# Patient Record
Sex: Male | Born: 1960 | Race: White | Hispanic: No | Marital: Married | Smoking: Never smoker
Health system: Southern US, Community
[De-identification: ages and names within clinical notes are randomized; demographics above are authoritative.]

## PROBLEM LIST (undated history)

## (undated) DIAGNOSIS — I10 Essential (primary) hypertension: Secondary | ICD-10-CM

## (undated) DIAGNOSIS — M109 Gout, unspecified: Secondary | ICD-10-CM

## (undated) DIAGNOSIS — E119 Type 2 diabetes mellitus without complications: Secondary | ICD-10-CM

## (undated) DIAGNOSIS — R011 Cardiac murmur, unspecified: Secondary | ICD-10-CM

## (undated) DIAGNOSIS — K219 Gastro-esophageal reflux disease without esophagitis: Secondary | ICD-10-CM

## (undated) DIAGNOSIS — G4730 Sleep apnea, unspecified: Secondary | ICD-10-CM

## (undated) DIAGNOSIS — M1990 Unspecified osteoarthritis, unspecified site: Secondary | ICD-10-CM

## (undated) DIAGNOSIS — T8859XA Other complications of anesthesia, initial encounter: Secondary | ICD-10-CM

## (undated) HISTORY — PX: COLONOSCOPY: SHX174

## (undated) HISTORY — DX: Gastro-esophageal reflux disease without esophagitis: K21.9

## (undated) HISTORY — PX: OTHER SURGICAL HISTORY: SHX169

## (undated) HISTORY — DX: Unspecified osteoarthritis, unspecified site: M19.90

---

## 2004-01-29 DIAGNOSIS — R945 Abnormal results of liver function studies: Secondary | ICD-10-CM | POA: Insufficient documentation

## 2004-08-23 HISTORY — PX: HERNIA REPAIR: SHX51

## 2004-08-23 HISTORY — PX: CHOLECYSTECTOMY: SHX55

## 2009-03-26 ENCOUNTER — Ambulatory Visit: Payer: Self-pay | Admitting: Cardiology

## 2010-04-10 ENCOUNTER — Encounter: Admission: RE | Admit: 2010-04-10 | Discharge: 2010-04-10 | Payer: Self-pay | Admitting: Orthopedic Surgery

## 2014-07-08 ENCOUNTER — Encounter: Payer: Self-pay | Admitting: Gastroenterology

## 2014-07-16 ENCOUNTER — Encounter: Payer: Self-pay | Admitting: Gastroenterology

## 2014-08-21 ENCOUNTER — Ambulatory Visit: Payer: Self-pay | Admitting: Gastroenterology

## 2014-08-23 DIAGNOSIS — K219 Gastro-esophageal reflux disease without esophagitis: Secondary | ICD-10-CM

## 2014-08-23 HISTORY — DX: Gastro-esophageal reflux disease without esophagitis: K21.9

## 2014-08-29 ENCOUNTER — Ambulatory Visit: Payer: Self-pay | Admitting: Gastroenterology

## 2014-08-29 ENCOUNTER — Encounter: Payer: Self-pay | Admitting: Cardiovascular Disease

## 2014-09-06 DIAGNOSIS — I209 Angina pectoris, unspecified: Secondary | ICD-10-CM | POA: Insufficient documentation

## 2014-09-25 ENCOUNTER — Encounter: Payer: Self-pay | Admitting: Gastroenterology

## 2014-09-25 ENCOUNTER — Other Ambulatory Visit: Payer: Self-pay

## 2014-09-25 ENCOUNTER — Ambulatory Visit (INDEPENDENT_AMBULATORY_CARE_PROVIDER_SITE_OTHER): Payer: BLUE CROSS/BLUE SHIELD | Admitting: Gastroenterology

## 2014-09-25 ENCOUNTER — Encounter (INDEPENDENT_AMBULATORY_CARE_PROVIDER_SITE_OTHER): Payer: Self-pay

## 2014-09-25 VITALS — BP 144/88 | HR 79 | Temp 98.1°F | Ht 71.0 in | Wt 247.2 lb

## 2014-09-25 DIAGNOSIS — K625 Hemorrhage of anus and rectum: Secondary | ICD-10-CM

## 2014-09-25 DIAGNOSIS — K219 Gastro-esophageal reflux disease without esophagitis: Secondary | ICD-10-CM | POA: Insufficient documentation

## 2014-09-25 DIAGNOSIS — K2270 Barrett's esophagus without dysplasia: Secondary | ICD-10-CM

## 2014-09-25 MED ORDER — PEG-KCL-NACL-NASULF-NA ASC-C 100 G PO SOLR: 1.0000 | Freq: Once | ORAL | Status: DC

## 2014-09-25 NOTE — Patient Instructions (Signed)
UPPER ENDOSCOPY  AND COLONOSCOPY IN 2-3 WEEKS.  FOLLOW UP IN 6-8 WEEKS.

## 2014-09-25 NOTE — Progress Notes (Signed)
On recall list 

## 2014-09-25 NOTE — Assessment & Plan Note (Signed)
SX CONTROLLED WITH DIET.  EGD FOR BARRETT'S SURVEILLANCE

## 2014-09-25 NOTE — Assessment & Plan Note (Signed)
WITH FAMILY HISTORY OF POLYPS. SX LIKELY DUE TO HEMORRHOIDS, LESS LIKELY CANCER OR POLYPS.  WOKE UP DURING LAST TCS IN 2010 AFTER FEN 150 MCG/VERSED 8 MG IV. TCS/?HEMORRHOID BANDING WITH MAC. DISCUSSED BENEFITS, AND MANAGEMENT OF HEMORRHOIDS([facility] BANDING, FLEX SIG BANDING, OR SURGERY). DISCUSSED PROCEDURE, BENEFITS, & RISKS: < 1% chance of medication reaction, bleeding, perforation, .PELVIC VEIN SEPSIS, or rupture of spleen/liver. FOLLOW UP IN 6 WEEKS.

## 2014-09-25 NOTE — Progress Notes (Addendum)
   Subjective:    Patient ID: Bryan Austin, male    DOB: Dec 28, 1960, 54 y.o.   MRN: [id number]  Ranae Palms, MD  HPI No rectal pressure??. NOTICES THEM THE MOST IF HE STRAINS TO USE THE HAVE A BM. BMs: TWICE A DAY,. CAN HAVE CONSTIPATION OFF AND ON. USUALLY EAT S AND GOES TO BATHROOM. IF HE WIPES MAY SEE BLOOD. HAD TCS WHEN AT AGE 64. BLEEDING REAL BAD AT THE TIME AND GRANDMOTHER HAD COLON CA. NO PAIN OR THROBBING. ITCHING IN RECTUM OFF AND ON. HAS TO WIPE MULTIPLE TIMES TO FEEL HE GETS CLEAN. RARE ASA.  Past Medical History  Diagnosis Date  . GERD without esophagitis 2016    NEG CARDIAC EVALUATION [facility]    Past Surgical History  Procedure Laterality Date  . Hernia repair  8466    COMPLICATED BY PROLONGED ILEUS  . Cholecystectomy  2006    GB FULL OF "SAND", NEEDED A DRAIN DUE TO INFECTION  . Colonoscopy  [facility]   No Known Allergies  Current Outpatient Prescriptions  Medication Sig Dispense Refill  . aspirin 81 MG tablet Take 81 mg by mouth as needed for pain.     . Family History  Problem Relation Age of Onset  . Colon polyps Mother   . Kidney disease Mother   . Colon cancer Maternal Grandmother    History  Substance Use Topics  . Smoking status: Never Smoker   . Smokeless tobacco: Not on file  . Alcohol Use: Not on file     Review of Systems PER HPI OTHERWISE ALL SYSTEMS ARE NEGATIVE.     Objective:   Physical Exam  Constitutional: He is oriented to person, place, and time. He appears well-developed and well-nourished. No distress.  HENT:  Head: Normocephalic and atraumatic.  Mouth/Throat: Oropharynx is clear and moist. No oropharyngeal exudate.  Eyes: Pupils are equal, round, and reactive to light. No scleral icterus.  Neck: Normal range of motion. Neck supple.  Cardiovascular: Normal rate, regular rhythm and normal heart sounds.   Pulmonary/Chest: Effort normal and breath sounds normal. No respiratory distress.  Abdominal: Soft. Bowel sounds are  normal. He exhibits no distension. There is no tenderness.  Musculoskeletal: He exhibits no edema.  Lymphadenopathy:    He has no cervical adenopathy.  Neurological: He is alert and oriented to person, place, and time.  Psychiatric: He has a normal mood and affect.  Vitals reviewed.         Assessment & Plan:

## 2014-09-26 NOTE — Progress Notes (Signed)
cc'ed to pcp °

## 2014-10-14 ENCOUNTER — Encounter (HOSPITAL_COMMUNITY): Payer: Self-pay | Admitting: *Deleted

## 2014-10-14 ENCOUNTER — Ambulatory Visit (HOSPITAL_COMMUNITY)
Admission: RE | Admit: 2014-10-14 | Discharge: 2014-10-14 | Disposition: A | Discharge status: Home / Self Care | Payer: BLUE CROSS/BLUE SHIELD | Source: Ambulatory Visit | Attending: Gastroenterology | Admitting: Gastroenterology

## 2014-10-14 ENCOUNTER — Encounter (HOSPITAL_COMMUNITY)
Admission: RE | Disposition: A | Discharge status: Home / Self Care | Payer: Self-pay | Source: Ambulatory Visit | Attending: Gastroenterology

## 2014-10-14 DIAGNOSIS — D124 Benign neoplasm of descending colon: Secondary | ICD-10-CM | POA: Diagnosis not present

## 2014-10-14 DIAGNOSIS — K2970 Gastritis, unspecified, without bleeding: Secondary | ICD-10-CM

## 2014-10-14 DIAGNOSIS — Z7982 Long term (current) use of aspirin: Secondary | ICD-10-CM | POA: Insufficient documentation

## 2014-10-14 DIAGNOSIS — K644 Residual hemorrhoidal skin tags: Secondary | ICD-10-CM | POA: Diagnosis not present

## 2014-10-14 DIAGNOSIS — K648 Other hemorrhoids: Secondary | ICD-10-CM | POA: Insufficient documentation

## 2014-10-14 DIAGNOSIS — Z8371 Family history of colonic polyps: Secondary | ICD-10-CM | POA: Insufficient documentation

## 2014-10-14 DIAGNOSIS — Z9049 Acquired absence of other specified parts of digestive tract: Secondary | ICD-10-CM | POA: Diagnosis not present

## 2014-10-14 DIAGNOSIS — K625 Hemorrhage of anus and rectum: Secondary | ICD-10-CM

## 2014-10-14 DIAGNOSIS — K2950 Unspecified chronic gastritis without bleeding: Secondary | ICD-10-CM | POA: Diagnosis not present

## 2014-10-14 DIAGNOSIS — K2270 Barrett's esophagus without dysplasia: Secondary | ICD-10-CM

## 2014-10-14 DIAGNOSIS — Z1211 Encounter for screening for malignant neoplasm of colon: Secondary | ICD-10-CM

## 2014-10-14 DIAGNOSIS — K219 Gastro-esophageal reflux disease without esophagitis: Secondary | ICD-10-CM | POA: Insufficient documentation

## 2014-10-14 HISTORY — PX: ESOPHAGOGASTRODUODENOSCOPY: SHX5428

## 2014-10-14 HISTORY — PX: HEMORRHOID BANDING: SHX5850

## 2014-10-14 HISTORY — PX: COLONOSCOPY: SHX5424

## 2014-10-14 SURGERY — COLONOSCOPY
Anesthesia: Moderate Sedation

## 2014-10-14 MED ORDER — SODIUM CHLORIDE 0.9 % IJ SOLN
INTRAMUSCULAR | Status: AC
  Filled 2014-10-14: qty 3

## 2014-10-14 MED ORDER — LIDOCAINE VISCOUS 2 % MT SOLN
OROMUCOSAL | Status: DC | PRN
  Administered 2014-10-14: 1 via OROMUCOSAL

## 2014-10-14 MED ORDER — MIDAZOLAM HCL 5 MG/5ML IJ SOLN
INTRAMUSCULAR | Status: AC
  Filled 2014-10-14: qty 10

## 2014-10-14 MED ORDER — MEPERIDINE HCL 100 MG/ML IJ SOLN
INTRAMUSCULAR | Status: AC
  Filled 2014-10-14: qty 2

## 2014-10-14 MED ORDER — PROMETHAZINE HCL 25 MG/ML IJ SOLN: 12.5000 mg | Freq: Once | INTRAMUSCULAR | Status: DC

## 2014-10-14 MED ORDER — PROMETHAZINE HCL 25 MG/ML IJ SOLN
INTRAMUSCULAR | Status: DC | PRN
  Administered 2014-10-14: 12.5 mg via INTRAVENOUS

## 2014-10-14 MED ORDER — MEPERIDINE HCL 100 MG/ML IJ SOLN
INTRAMUSCULAR | Status: DC | PRN
  Administered 2014-10-14: 25 mg via INTRAVENOUS
  Administered 2014-10-14 (×2): 50 mg via INTRAVENOUS

## 2014-10-14 MED ORDER — SODIUM CHLORIDE 0.9 % IV SOLN
INTRAVENOUS | Status: DC
  Administered 2014-10-14: 13:00:00 via INTRAVENOUS

## 2014-10-14 MED ORDER — LIDOCAINE VISCOUS 2 % MT SOLN
OROMUCOSAL | Status: AC
  Filled 2014-10-14: qty 15

## 2014-10-14 MED ORDER — PROMETHAZINE HCL 25 MG/ML IJ SOLN
INTRAMUSCULAR | Status: AC
  Filled 2014-10-14: qty 1

## 2014-10-14 MED ORDER — MIDAZOLAM HCL 5 MG/5ML IJ SOLN
INTRAMUSCULAR | Status: DC | PRN
  Administered 2014-10-14 (×2): 2 mg via INTRAVENOUS
  Administered 2014-10-14: 1 mg via INTRAVENOUS
  Administered 2014-10-14 (×2): 2 mg via INTRAVENOUS

## 2014-10-14 NOTE — Discharge Instructions (Signed)
You had 1 SMALL Polyp removed. You HAVE SMALL internal AND MODERTAE EXTERNAL hemorrhoids. You have gastritis MOST LIKELY DUE TO IBUPROFEN.  I biopsied your stomach.   CONTINUE YOUR WEIGHT LOSS EFFORTS.  FOLLOW A HIGH FIBER/LOW FAT DIET. AVOID ITEMS THAT CAUSE BLOATING. SEE INFO BELOW.  YOUR BIOPSY RESULTS WILL BE AVAILABLE IN MY CHART AFTER FEB 25 OR MY OFFICE WILL CONTACT YOU IN 10-14 DAYS WITH YOUR RESULTS.   Next colonoscopy in 5-10 years.   ENDOSCOPY Care After Read the instructions outlined below and refer to this sheet in the next week. These discharge instructions provide you with general information on caring for yourself after you leave the hospital. While your treatment has been planned according to the most current medical practices available, unavoidable complications occasionally occur. If you have any problems or questions after discharge, call DR. Jenese Mischke, [phone].  ACTIVITY  You may resume your regular activity, but move at a slower pace for the next 24 hours.   Take frequent rest periods for the next 24 hours.   Walking will help get rid of the air and reduce the bloated feeling in your belly (abdomen).   No driving for 24 hours (because of the medicine (anesthesia) used during the test).   You may shower.   Do not sign any important legal documents or operate any machinery for 24 hours (because of the anesthesia used during the test).    NUTRITION  Drink plenty of fluids.   You may resume your normal diet as instructed by your doctor.   Begin with a light meal and progress to your normal diet. Heavy or fried foods are harder to digest and may make you feel sick to your stomach (nauseated).   Avoid alcoholic beverages for 24 hours or as instructed.    MEDICATIONS  You may resume your normal medications.   WHAT YOU CAN EXPECT TODAY  Some feelings of bloating in the abdomen.   Passage of more gas than usual.   Spotting of blood in your stool or  on the toilet paper  .  IF YOU HAD POLYPS REMOVED DURING THE ENDOSCOPY:  Eat a soft diet IF YOU HAVE NAUSEA, BLOATING, ABDOMINAL PAIN, OR VOMITING.    FINDING OUT THE RESULTS OF YOUR TEST Not all test results are available during your visit. DR. Oneida Alar WILL CALL YOU WITHIN 14 DAYS OF YOUR PROCEDUE WITH YOUR RESULTS. Do not assume everything is normal if you have not heard from DR. Jeryl Umholtz IN TWO WEEKS, CALL HER OFFICE AT [phone].  SEEK IMMEDIATE MEDICAL ATTENTION AND CALL THE OFFICE: [phone] IF:  You have more than a spotting of blood in your stool.   Your belly is swollen (abdominal distention).   You are nauseated or vomiting.   You have a temperature over 101F.   You have abdominal pain or discomfort that is severe or gets worse throughout the day.   Gastritis  Gastritis is an inflammation (the body's way of reacting to injury and/or infection) of the stomach. It is often caused by viral or bacterial (germ) infections. It can also be caused BY ALCOHOL, ASPIRIN, BC/GOODY POWDER'S, (IBUPROFEN) MOTRIN, OR ALEVE (NAPROXEN), chemicals (including alcohol), SPICY FOODS, and medications. This illness may be associated with generalized malaise (feeling tired, not well), UPPER ABDOMINAL STOMACH cramps, and fever. One common bacterial cause of gastritis is an organism known as H. Pylori. This can be treated with antibiotics.    High-Fiber Diet A high-fiber diet changes your normal diet to  include more whole grains, legumes, fruits, and vegetables. Changes in the diet involve replacing refined carbohydrates with unrefined foods. The calorie level of the diet is essentially unchanged. The Dietary Reference Intake (recommended amount) for adult males is 38 grams per day. For adult females, it is 25 grams per day. Pregnant and lactating women should consume 28 grams of fiber per day. Fiber is the intact part of a plant that is not broken down during digestion. Functional fiber is fiber  that has been isolated from the plant to provide a beneficial effect in the body. PURPOSE  Increase stool bulk.   Ease and regulate bowel movements.   Lower cholesterol.  INDICATIONS THAT YOU NEED MORE FIBER  Constipation and hemorrhoids.   Uncomplicated diverticulosis (intestine condition) and irritable bowel syndrome.   Weight management.   As a protective measure against hardening of the arteries (atherosclerosis), diabetes, and cancer.   GUIDELINES FOR INCREASING FIBER IN THE DIET  Start adding fiber to the diet slowly. A gradual increase of about 5 more grams (2 slices of whole-wheat bread, 2 servings of most fruits or vegetables, or 1 bowl of high-fiber cereal) per day is best. Too rapid an increase in fiber may result in constipation, flatulence, and bloating.   Drink enough water and fluids to keep your urine clear or pale yellow. Water, juice, or caffeine-free drinks are recommended. Not drinking enough fluid may cause constipation.   Eat a variety of high-fiber foods rather than one type of fiber.   Try to increase your intake of fiber through using high-fiber foods rather than fiber pills or supplements that contain small amounts of fiber.   The goal is to change the types of food eaten. Do not supplement your present diet with high-fiber foods, but replace foods in your present diet.  INCLUDE A VARIETY OF FIBER SOURCES  Replace refined and processed grains with whole grains, canned fruits with fresh fruits, and incorporate other fiber sources. White rice, white breads, and most bakery goods contain little or no fiber.   Brown whole-grain rice, buckwheat oats, and many fruits and vegetables are all good sources of fiber. These include: broccoli, Brussels sprouts, cabbage, cauliflower, beets, sweet potatoes, white potatoes (skin on), carrots, tomatoes, eggplant, squash, berries, fresh fruits, and dried fruits.   Cereals appear to be the richest source of fiber. Cereal  fiber is found in whole grains and bran. Bran is the fiber-rich outer coat of cereal grain, which is largely removed in refining. In whole-grain cereals, the bran remains. In breakfast cereals, the largest amount of fiber is found in those with "bran" in their names. The fiber content is sometimes indicated on the label.   You may need to include additional fruits and vegetables each day.   In baking, for 1 cup white flour, you may use the following substitutions:   1 cup whole-wheat flour minus 2 tablespoons.   1/2 cup white flour plus 1/2 cup whole-wheat flour.   Low-Fat Diet BREADS, CEREALS, PASTA, RICE, DRIED PEAS, AND BEANS These products are high in carbohydrates and most are low in fat. Therefore, they can be increased in the diet as substitutes for fatty foods. They too, however, contain calories and should not be eaten in excess. Cereals can be eaten for snacks as well as for breakfast.  Include foods that contain fiber (fruits, vegetables, whole grains, and legumes). Research shows that fiber may lower blood cholesterol levels, especially the water-soluble fiber found in fruits, vegetables, oat products, and legumes.  FRUITS AND VEGETABLES It is good to eat fruits and vegetables. Besides being sources of fiber, both are rich in vitamins and some minerals. They help you get the daily allowances of these nutrients. Fruits and vegetables can be used for snacks and desserts. MEATS Limit lean meat, chicken, [place], and fish to no more than 6 ounces per day. Beef, Pork, and Lamb Use lean cuts of beef, pork, and lamb. Lean cuts include:  Extra-lean ground beef.  Arm roast.  Sirloin tip.  Center-cut ham.  Round steak.  Loin chops.  Rump roast.  Tenderloin.  Trim all fat off the outside of meats before cooking. It is not necessary to severely decrease the intake of red meat, but lean choices should be made. Lean meat is rich in protein and contains a highly absorbable form of iron.  Premenopausal women, in particular, should avoid reducing lean red meat because this could increase the risk for low red blood cells (iron-deficiency anemia). The organ meats, such as liver, sweetbreads, kidneys, and brain are very rich in cholesterol. They should be limited. Chicken and [place] These are good sources of protein. The fat of poultry can be reduced by removing the skin and underlying fat layers before cooking. Chicken and [place] can be substituted for lean red meat in the diet. Poultry should not be fried or covered with high-fat sauces. Fish and Shellfish Fish is a good source of protein. Shellfish contain cholesterol, but they usually are low in saturated fatty acids. The preparation of fish is important. Like chicken and [place], they should not be fried or covered with high-fat sauces. EGGS Egg whites contain no fat or cholesterol. They can be eaten often. Try 1 to 2 egg whites instead of whole eggs in recipes or use egg substitutes that do not contain yolk. MILK AND DAIRY PRODUCTS Use skim or 1% milk instead of 2% or whole milk. Decrease whole milk, natural, and processed cheeses. Use nonfat or low-fat (2%) cottage cheese or low-fat cheeses made from vegetable oils. Choose nonfat or low-fat (1 to 2%) yogurt. Experiment with evaporated skim milk in recipes that call for heavy cream. Substitute low-fat yogurt or low-fat cottage cheese for sour cream in dips and salad dressings. Have at least 2 servings of low-fat dairy products, such as 2 glasses of skim (or 1%) milk each day to help get your daily calcium intake.  FATS AND OILS Reduce the total intake of fats, especially saturated fat. Butterfat, lard, and beef fats are high in saturated fat and cholesterol. These should be avoided as much as possible. Vegetable fats do not contain cholesterol, but certain vegetable fats, such as coconut oil, palm oil, and palm kernel oil are very high in saturated fats. These should be limited. These  fats are often used in bakery goods, processed foods, popcorn, oils, and nondairy creamers. Vegetable shortenings and some peanut butters contain hydrogenated oils, which are also saturated fats. Read the labels on these foods and check for saturated vegetable oils. Unsaturated vegetable oils and fats do not raise blood cholesterol. However, they should be limited because they are fats and are high in calories. Total fat should still be limited to 30% of your daily caloric intake. Desirable liquid vegetable oils are corn oil, cottonseed oil, olive oil, canola oil, safflower oil, soybean oil, and sunflower oil. Peanut oil is not as good, but small amounts are acceptable. Buy a heart-healthy tub margarine that has no partially hydrogenated oils in the ingredients. Mayonnaise and salad dressings often  are made from unsaturated fats, but they should also be limited because of their high calorie and fat content. Seeds, nuts, peanut butter, olives, and avocados are high in fat, but the fat is mainly the unsaturated type. These foods should be limited mainly to avoid excess calories and fat. OTHER EATING TIPS Snacks  Most sweets should be limited as snacks. They tend to be rich in calories and fats, and their caloric content outweighs their nutritional value. Some good choices in snacks are graham crackers, melba toast, soda crackers, bagels (no egg), English muffins, fruits, and vegetables. These snacks are preferable to snack crackers, [place] fries, and chips. Popcorn should be air-popped or cooked in small amounts of liquid vegetable oil. Desserts Eat fruit, low-fat yogurt, and fruit ices. AVOID pastries, cake, and cookies. Sherbet, angel food cake, gelatin dessert, frozen low-fat yogurt, or other frozen products that do not contain saturated fat (pure fruit juice bars, frozen ice pops) are also acceptable.  COOKING METHODS Choose those methods that use little or no fat. They include: Poaching.  Braising.    Steaming.  Grilling.  Baking.  Stir-frying.  Broiling.  Microwaving.  Foods can be cooked in a nonstick pan without added fat, or use a nonfat cooking spray in regular cookware. Limit fried foods and avoid frying in saturated fat. Add moisture to lean meats by using water, broth, cooking wines, and other nonfat or low-fat sauces along with the cooking methods mentioned above. Soups and stews should be chilled after cooking. The fat that forms on top after a few hours in the refrigerator should be skimmed off. When preparing meals, avoid using excess salt. Salt can contribute to raising blood pressure in some people. EATING AWAY FROM HOME Order entres, potatoes, and vegetables without sauces or butter. When meat exceeds the size of a deck of cards (3 to 4 ounces), the rest can be taken home for another meal. Choose vegetable or fruit salads and ask for low-calorie salad dressings to be served on the side. Use dressings sparingly. Limit high-fat toppings, such as bacon, crumbled eggs, cheese, sunflower seeds, and olives. Ask for heart-healthy tub margarine instead of butter.  Polyps, Colon  A polyp is extra tissue that grows inside your body. Colon polyps grow in the large intestine. The large intestine, also called the colon, is part of your digestive system. It is a long, hollow tube at the end of your digestive tract where your body makes and stores stool. Most polyps are not dangerous. They are benign. This means they are not cancerous. But over time, some types of polyps can turn into cancer. Polyps that are smaller than a pea are usually not harmful. But larger polyps could someday become or may already be cancerous. To be safe, doctors remove all polyps and test them.   WHO GETS POLYPS? Anyone can get polyps, but certain people are more likely than others. You may have a greater chance of getting polyps if:  You are over 50.   You have had polyps before.   Someone in your family has  had polyps.   Someone in your family has had cancer of the large intestine.   Find out if someone in your family has had polyps. You may also be more likely to get polyps if you:   Eat a lot of fatty foods   Smoke   Drink alcohol   Do not exercise  Eat too much   TREATMENT  The caregiver will remove the polyp during  sigmoidoscopy or colonoscopy.  PREVENTION There is not one sure way to prevent polyps. You might be able to lower your risk of getting them if you:  Eat more fruits and vegetables and less fatty food.   Do not smoke.   Avoid alcohol.   Exercise every day.   Lose weight if you are overweight.   Eating more calcium and folate can also lower your risk of getting polyps. Some foods that are rich in calcium are milk, cheese, and broccoli. Some foods that are rich in folate are chickpeas, kidney beans, and spinach.    Hemorrhoids Hemorrhoids are dilated (enlarged) veins around the rectum. Sometimes clots will form in the veins. This makes them swollen and painful. These are called thrombosed hemorrhoids. Causes of hemorrhoids include:  Constipation.   Straining to have a bowel movement.   HEAVY LIFTING HOME CARE INSTRUCTIONS  Eat a well balanced diet and drink 6 to 8 glasses of water every day to avoid constipation. You may also use a bulk laxative.   Avoid straining to have bowel movements.   Keep anal area dry and clean.   Do not use a donut shaped pillow or sit on the toilet for long periods. This increases blood pooling and pain.   Move your bowels when your body has the urge; this will require less straining and will decrease pain and pressure.

## 2014-10-14 NOTE — H&P (View-Only) (Signed)
   Subjective:    Patient ID: Bryan Austin, male    DOB: [birth year], 54 y.o.   MRN: [id number]  Ranae Palms, MD  HPI No rectal pressure??. NOTICES THEM THE MOST IF HE STRAINS TO USE THE HAVE A BM. BMs: TWICE A DAY,. CAN HAVE CONSTIPATION OFF AND ON. USUALLY EAT S AND GOES TO BATHROOM. IF HE WIPES MAY SEE BLOOD. HAD TCS WHEN AT AGE 47. BLEEDING REAL BAD AT THE TIME AND GRANDMOTHER HAD COLON CA. NO PAIN OR THROBBING. ITCHING IN RECTUM OFF AND ON. HAS TO WIPE MULTIPLE TIMES TO FEEL HE GETS CLEAN. RARE ASA.  Past Medical History  Diagnosis Date  . GERD without esophagitis 2016    NEG CARDIAC EVALUATION [facility]    Past Surgical History  Procedure Laterality Date  . Hernia repair  1791    COMPLICATED BY PROLONGED ILEUS  . Cholecystectomy  2006    GB FULL OF "SAND", NEEDED A DRAIN DUE TO INFECTION  . Colonoscopy  [facility]   No Known Allergies  Current Outpatient Prescriptions  Medication Sig Dispense Refill  . aspirin 81 MG tablet Take 81 mg by mouth as needed for pain.     . Family History  Problem Relation Age of Onset  . Colon polyps Mother   . Kidney disease Mother   . Colon cancer Maternal Grandmother    History  Substance Use Topics  . Smoking status: Never Smoker   . Smokeless tobacco: Not on file  . Alcohol Use: Not on file     Review of Systems PER HPI OTHERWISE ALL SYSTEMS ARE NEGATIVE.     Objective:   Physical Exam        Assessment & Plan:

## 2014-10-14 NOTE — Op Note (Signed)
[facility] [address] [place], [address]   COLONOSCOPY PROCEDURE REPORT  PATIENT: Bryan, Austin  MR#: [id number] BIRTHDATE: 1960-10-09 , 54  yrs. old GENDER: male ENDOSCOPIST: Danie Binder, MD REFERRED ZT:IWPY Park, M.D. PROCEDURE DATE:  2014/10/16 PROCEDURE:   Colonoscopy with biopsy INDICATIONS:patient's family history of colon polyps-MOTHER HAD POLYPS MEDICATIONS: Promethazine (Phenergan) 12.5 mg IV, Demerol 100 mg IV, and Versed 6 mg IV  DESCRIPTION OF PROCEDURE:    Physical exam was performed.  Informed consent was obtained from the patient after explaining the benefits, risks, and alternatives to procedure.  The patient was connected to monitor and placed in left lateral position. Continuous oxygen was provided by nasal cannula and IV medicine administered through an indwelling cannula.  After administration of sedation and rectal exam, the patients rectum was intubated and the EC-[id number] ([id number])  colonoscope was advanced under direct visualization to the cecum.  The scope was removed slowly by carefully examining the color, texture, anatomy, and integrity mucosa on the way out.  The patient was recovered in endoscopy and discharged home in satisfactory condition.    COLON FINDINGS: The colon was redundant.  Manual abdominal counter-pressure was used to reach the cecum, A sessile polyp measuring 3 mm in size was found in the descending colon.  A polypectomy was performed with cold forceps.  , Internal hemorrhoids were found.  , and Moderate sized external hemorrhoids were found.  PREP QUALITY: good. CECAL W/D TIME: 16       minutes  COMPLICATIONS: None  ENDOSCOPIC IMPRESSION: 1.   The LEFT colon IS redundant 2.   ONE COLONPOLYP REMOVED 3.   SMALL Internal hemorrhoids 4.   Moderate sized external hemorrhoids  RECOMMENDATIONS: CONTINUE YOUR WEIGHT LOSS EFFORTS. FOLLOW A HIGH FIBER/LOW FAT DIET.  AVOID ITEMS THAT CAUSE  BLOATING.  AWAIT BIOPSY SEE SURGERY FOR HEMORRHOIDECTOMY Next colonoscopy in 5-10 years.   _______________________________ eSignedDanie Binder, MD 16-Oct-2014 3:08 PM    CPT CODES: ICD CODES:  The ICD and CPT codes recommended by this software are interpretations from the data that the clinical staff has captured with the software.  The verification of the translation of this report to the ICD and CPT codes and modifiers is the sole responsibility of the health care institution and practicing physician where this report was generated.  [employer]. will not be held responsible for the validity of the ICD and CPT codes included on this report.  AMA assumes no liability for data contained or not contained herein. CPT is a Designer, television/film set of the [employer].

## 2014-10-14 NOTE — Interval H&P Note (Signed)
History and Physical Interval Note:  10/14/2014 1:24 PM  Bryan Austin  has presented today for surgery, with the diagnosis of rectal bleeding, barretts surveillance  The various methods of treatment have been discussed with the patient and family. After consideration of risks, benefits and other options for treatment, the patient has consented to  Procedure(s) with comments: COLONOSCOPY (N/A) - 100pm ESOPHAGOGASTRODUODENOSCOPY (EGD) (N/A) HEMORRHOID BANDING (N/A) as a surgical intervention .  The patient's history has been reviewed, patient examined, no change in status, stable for surgery.  I have reviewed the patient's chart and labs.  Questions were answered to the patient's satisfaction.     [employer]

## 2014-10-14 NOTE — Op Note (Addendum)
[facility] [address] [place], [address]   ENDOSCOPY PROCEDURE REPORT  PATIENT: Bryan Austin, Bryan Austin  MR#: [id number] BIRTHDATE: 1961/08/14 , 5  yrs. old GENDER: male  ENDOSCOPIST: Danie Binder, MD REFERRED GB:TDVV Park, M.D.  PROCEDURE DATE: 10-19-2014 PROCEDURE:   EGD w/ biopsy  INDICATIONS:screening for Barrett's. USES IBUPROFEN. MEDICATIONS: Demerol 25 mg IV and Versed 3 mg IV TOPICAL ANESTHETIC:   Viscous Xylocaine ASA CLASS:  DESCRIPTION OF PROCEDURE:     Physical exam was performed.  Informed consent was obtained from the patient after explaining the benefits, risks, and alternatives to the procedure.  The patient was connected to the monitor and placed in the left lateral position.  Continuous oxygen was provided by nasal cannula and IV medicine administered through an indwelling cannula.  After administration of sedation, the patients esophagus was intubated and the EC-[id number] ([id number])  endoscope was advanced under direct visualization to the second portion of the duodenum.  The scope was removed slowly by carefully examining the color, texture, anatomy, and integrity of the mucosa on the way out.  The patient was recovered in endoscopy and discharged home in satisfactory condition.   ESOPHAGUS: The mucosa of the esophagus appeared normal.   NO BARRET'S. STOMACH: Mild erosive gastritis (inflammation) was found in the gastric antrum.  Multiple biopsies were performed using cold forceps.   DUODENUM: The duodenal mucosa showed no abnormalities in the bulb and 2nd part of the duodenum. COMPLICATIONS: There were no immediate complications.  ENDOSCOPIC IMPRESSION: 1.   NORMAL ESOPHAGUS 2.   MILD Erosive gastritis  RECOMMENDATIONS: CONTINUE YOUR WEIGHT LOSS EFFORTS. FOLLOW A HIGH FIBER/LOW FAT DIET.  AVOID ITEMS THAT CAUSE BLOATING.  AWAIT BIOPSY SEE SURGERY FOR HEMORRHOIDECTOMY Next colonoscopy in 5-10 years.  REPEAT  EXAM: ___________________________ eSignedDanie Binder, MD 10/19/14 3:13 PM     CPT CODES: ICD CODES:  The ICD and CPT codes recommended by this software are interpretations from the data that the clinical staff has captured with the software.  The verification of the translation of this report to the ICD and CPT codes and modifiers is the sole responsibility of the health care institution and practicing physician where this report was generated.  [employer]. will not be held responsible for the validity of the ICD and CPT codes included on this report.  AMA assumes no liability for data contained or not contained herein. CPT is a Designer, television/film set of the [employer].

## 2014-10-16 ENCOUNTER — Encounter (HOSPITAL_COMMUNITY): Payer: Self-pay | Admitting: Gastroenterology

## 2014-10-16 ENCOUNTER — Telehealth: Payer: Self-pay | Admitting: Gastroenterology

## 2014-10-16 NOTE — Telephone Encounter (Signed)
Please call pt. He had A simple adenoma removed. His stomach Bx shows gastritis FROM IBUPROFEN.    CONTINUE YOUR WEIGHT LOSS EFFORTS. FOLLOW A HIGH FIBER/LOW FAT DIET. AVOID ITEMS THAT CAUSE BLOATING.  Next colonoscopy in 5-10 years.

## 2014-10-21 NOTE — Telephone Encounter (Signed)
LMOM to call.

## 2014-10-21 NOTE — Telephone Encounter (Signed)
Reminder in epic °

## 2014-10-22 ENCOUNTER — Encounter: Payer: Self-pay | Admitting: Gastroenterology

## 2014-10-22 NOTE — Telephone Encounter (Signed)
Pt is aware of results. 

## 2014-12-11 ENCOUNTER — Ambulatory Visit (INDEPENDENT_AMBULATORY_CARE_PROVIDER_SITE_OTHER): Payer: BLUE CROSS/BLUE SHIELD | Admitting: Gastroenterology

## 2014-12-11 ENCOUNTER — Encounter: Payer: Self-pay | Admitting: Gastroenterology

## 2014-12-11 VITALS — BP 154/85 | HR 70 | Temp 97.0°F | Ht 71.0 in | Wt 261.4 lb

## 2014-12-11 DIAGNOSIS — K219 Gastro-esophageal reflux disease without esophagitis: Secondary | ICD-10-CM | POA: Diagnosis not present

## 2014-12-11 DIAGNOSIS — K625 Hemorrhage of anus and rectum: Secondary | ICD-10-CM

## 2014-12-11 NOTE — Assessment & Plan Note (Signed)
SYMPTOMS CONTROLLED/RESOLVED WITH DIET. WEIGHT GAIN 14 LBS SINCE FEB 2016.  CONTINUE YOUR WEIGHT LOSS EFFORTS. LOW FAT DIET FOLLOW UP IN 1 YEAR.

## 2014-12-11 NOTE — Assessment & Plan Note (Signed)
RARE DUE TO IH.  CONTINUE TO MONITOR SYMPTOMS. DRINK WATER EAT FIBER FOLLOW UP IN 1 YEAR.

## 2014-12-11 NOTE — Progress Notes (Signed)
cc'ed to pcp °

## 2014-12-11 NOTE — Patient Instructions (Signed)
CONTINUE YOUR WEIGHT LOSS EFFORTS.  FOLLOW A HIGH FIBER/LOW FAT DIET. SEE INFO BELOW ON A LOW FAT DIET.   AVOID TRIGGERS FOR REFLUX. SEE INFO BELOW.  FOLLOW UP IN 1 YEAR.     Lifestyle and home remedies TO MANAGE REFLUX/CHEST PAIN  You may eliminate or reduce the frequency of heartburn by making the following lifestyle changes:  . Control your weight. Being overweight is a major risk factor for heartburn and GERD. Excess pounds put pressure on your abdomen, pushing up your stomach and causing acid to back up into your esophagus.   . Eat smaller meals. 4 TO 6 MEALS A DAY. This reduces pressure on the lower esophageal sphincter, helping to prevent the valve from opening and acid from washing back into your esophagus.   Dolphus Jenny your belt. Clothes that fit tightly around your waist put pressure on your abdomen and the lower esophageal sphincter.   . Eliminate heartburn triggers. Everyone has specific triggers. Common triggers such as fatty or fried foods, spicy food, tomato sauce, carbonated beverages, alcohol, chocolate, mint, garlic, onion, caffeine and nicotine may make heartburn worse.   Marland Kitchen Avoid stooping or bending. Tying your shoes is OK. Bending over for longer periods to weed your garden isn't, especially soon after eating.   . Don't lie down after a meal. Wait at least three to four hours after eating before going to bed, and don't lie down right after eating.   Marland Kitchen PUT THE HEAD OF YOUR BED ON 6 INCH BLOCKS.   Alternative medicine . Several home remedies exist for treating GERD, but they provide only temporary relief. They include drinking baking soda (sodium bicarbonate) added to water or drinking other fluids such as baking soda mixed with cream of tartar and water.  . Although these liquids create temporary relief by neutralizing, washing away or buffering acids, eventually they aggravate the situation by adding gas and fluid to your stomach, increasing pressure and causing  more acid reflux. Further, adding more sodium to your diet may increase your blood pressure and add stress to your heart, and excessive bicarbonate ingestion can alter the acid-base balance in your body.   Low-Fat Diet BREADS, CEREALS, PASTA, RICE, DRIED PEAS, AND BEANS These products are high in carbohydrates and most are low in fat. Therefore, they can be increased in the diet as substitutes for fatty foods. They too, however, contain calories and should not be eaten in excess. Cereals can be eaten for snacks as well as for breakfast.   FRUITS AND VEGETABLES It is good to eat fruits and vegetables. Besides being sources of fiber, both are rich in vitamins and some minerals. They help you get the daily allowances of these nutrients. Fruits and vegetables can be used for snacks and desserts.  MEATS Limit lean meat, chicken, [place], and fish to no more than 6 ounces per day. Beef, Pork, and Lamb Use lean cuts of beef, pork, and lamb. Lean cuts include:  Extra-lean ground beef.  Arm roast.  Sirloin tip.  Center-cut ham.  Round steak.  Loin chops.  Rump roast.  Tenderloin.  Trim all fat off the outside of meats before cooking. It is not necessary to severely decrease the intake of red meat, but lean choices should be made. Lean meat is rich in protein and contains a highly absorbable form of iron. Premenopausal women, in particular, should avoid reducing lean red meat because this could increase the risk for low red blood cells (iron-deficiency anemia).  Chicken  and [place] These are good sources of protein. The fat of poultry can be reduced by removing the skin and underlying fat layers before cooking. Chicken and [place] can be substituted for lean red meat in the diet. Poultry should not be fried or covered with high-fat sauces. Fish and Shellfish Fish is a good source of protein. Shellfish contain cholesterol, but they usually are low in saturated fatty acids. The preparation of fish is  important. Like chicken and [place], they should not be fried or covered with high-fat sauces. EGGS Egg whites contain no fat or cholesterol. They can be eaten often. Try 1 to 2 egg whites instead of whole eggs in recipes or use egg substitutes that do not contain yolk. MILK AND DAIRY PRODUCTS Use skim or 1% milk instead of 2% or whole milk. Decrease whole milk, natural, and processed cheeses. Use nonfat or low-fat (2%) cottage cheese or low-fat cheeses made from vegetable oils. Choose nonfat or low-fat (1 to 2%) yogurt. Experiment with evaporated skim milk in recipes that call for heavy cream. Substitute low-fat yogurt or low-fat cottage cheese for sour cream in dips and salad dressings. Have at least 2 servings of low-fat dairy products, such as 2 glasses of skim (or 1%) milk each day to help get your daily calcium intake. FATS AND OILS Reduce the total intake of fats, especially saturated fat. Butterfat, lard, and beef fats are high in saturated fat and cholesterol. These should be avoided as much as possible. Vegetable fats do not contain cholesterol, but certain vegetable fats, such as coconut oil, palm oil, and palm kernel oil are very high in saturated fats. These should be limited. These fats are often used in bakery goods, processed foods, popcorn, oils, and nondairy creamers. Vegetable shortenings and some peanut butters contain hydrogenated oils, which are also saturated fats. Read the labels on these foods and check for saturated vegetable oils. Unsaturated vegetable oils and fats do not raise blood cholesterol. However, they should be limited because they are fats and are high in calories. Total fat should still be limited to 30% of your daily caloric intake. Desirable liquid vegetable oils are corn oil, cottonseed oil, olive oil, canola oil, safflower oil, soybean oil, and sunflower oil. Peanut oil is not as good, but small amounts are acceptable. Buy a heart-healthy tub margarine that has no  partially hydrogenated oils in the ingredients. Mayonnaise and salad dressings often are made from unsaturated fats, but they should also be limited because of their high calorie and fat content. Seeds, nuts, peanut butter, olives, and avocados are high in fat, but the fat is mainly the unsaturated type. These foods should be limited mainly to avoid excess calories and fat. OTHER EATING TIPS Snacks  Most sweets should be limited as snacks. They tend to be rich in calories and fats, and their caloric content outweighs their nutritional value. Some good choices in snacks are graham crackers, melba toast, soda crackers, bagels (no egg), English muffins, fruits, and vegetables. These snacks are preferable to snack crackers, [place] fries, TORTILLA CHIPS, and POTATO chips. Popcorn should be air-popped or cooked in small amounts of liquid vegetable oil. Desserts Eat fruit, low-fat yogurt, and fruit ices instead of pastries, cake, and cookies. Sherbet, angel food cake, gelatin dessert, frozen low-fat yogurt, or other frozen products that do not contain saturated fat (pure fruit juice bars, frozen ice pops) are also acceptable.  COOKING METHODS Choose those methods that use little or no fat. They include: Poaching.  Braising.  Steaming.  Grilling.  Baking.  Stir-frying.  Broiling.  Microwaving.  Foods can be cooked in a nonstick pan without added fat, or use a nonfat cooking spray in regular cookware. Limit fried foods and avoid frying in saturated fat. Add moisture to lean meats by using water, broth, cooking wines, and other nonfat or low-fat sauces along with the cooking methods mentioned above. Soups and stews should be chilled after cooking. The fat that forms on top after a few hours in the refrigerator should be skimmed off. When preparing meals, avoid using excess salt. Salt can contribute to raising blood pressure in some people.  EATING AWAY FROM HOME Order entres, potatoes, and vegetables  without sauces or butter. When meat exceeds the size of a deck of cards (3 to 4 ounces), the rest can be taken home for another meal. Choose vegetable or fruit salads and ask for low-calorie salad dressings to be served on the side. Use dressings sparingly. Limit high-fat toppings, such as bacon, crumbled eggs, cheese, sunflower seeds, and olives. Ask for heart-healthy tub margarine instead of butter.

## 2014-12-11 NOTE — Progress Notes (Signed)
   Subjective:    Patient ID: Bryan Austin, male    DOB: October 30, 1960, 54 y.o.   MRN: [id number]  Ranae Palms, MD  HPI HEMORRHOIDS: WHEN HE USES BMs HAS TO CLEAN AND CLEAN. RARE ITCHING, NO PAIN. BRBPR: 1X/MOSML AMOUNT. REMEMBERED THE END OF HIS TCS. BMs: 1-2X/DAY(#3-4).  PT DENIES FEVER, CHILLS, nausea, vomiting, melena, diarrhea, CHEST PAIN, SHORTNESS OF BREATH,  constipation, abdominal pain, problems swallowing, problems with sedation, OR heartburn or indigestion.   Past Medical History  Diagnosis Date  . GERD without esophagitis 2016    NEG CARDIAC EVALUATION [facility]   Past Surgical History  Procedure Laterality Date  . Hernia repair  5456    COMPLICATED BY PROLONGED ILEUS  . Cholecystectomy  2006    GB FULL OF "SAND", NEEDED A DRAIN DUE TO INFECTION  . Colonoscopy  [facility]  . Colonoscopy N/A 10/14/2014    Procedure: COLONOSCOPY;  Surgeon: Danie Binder, MD;  [facility];  Service: Endoscopy;  Laterality: N/A;  100pm  . Esophagogastroduodenoscopy N/A 10/14/2014    Procedure: ESOPHAGOGASTRODUODENOSCOPY (EGD);  Surgeon: Danie Binder, MD;  Location[facility];  Service: Endoscopy;  Laterality: N/A;  . Hemorrhoid banding N/A 10/14/2014    Procedure: HEMORRHOID BANDING;  Surgeon: Danie Binder, MD;  [facility];  Service: Endoscopy;  Laterality: N/A;   No Known Allergies  Current Outpatient Prescriptions  Medication Sig Dispense Refill  . colchicine 0.6 MG tablet Take 0.6 mg by mouth daily as needed (gout flare).    . fexofenadine-pseudoephedrine (ALLEGRA-D 24) 180-240 MG per 24 hr tablet Take 1 tablet by mouth daily.    Marland Kitchen ibuprofen (ADVIL,MOTRIN) 200 MG tablet Take 200 mg by mouth every 6 (six) hours as needed for headache or mild pain. VERY RARE       Review of Systems     Objective:   Physical Exam  Constitutional: He is oriented to person, place, and time. He appears well-developed and well-nourished. No distress.  HENT:  Head: Normocephalic  and atraumatic.  Mouth/Throat: Oropharynx is clear and moist. No oropharyngeal exudate.  Eyes: Pupils are equal, round, and reactive to light. No scleral icterus.  Neck: Normal range of motion. Neck supple.  Cardiovascular: Normal rate, regular rhythm and normal heart sounds.   Pulmonary/Chest: Effort normal and breath sounds normal. No respiratory distress.  Abdominal: Soft. Bowel sounds are normal. He exhibits no distension. There is no tenderness.  Musculoskeletal: He exhibits no edema.  Lymphadenopathy:    He has no cervical adenopathy.  Neurological: He is alert and oriented to person, place, and time.  Psychiatric: He has a normal mood and affect.  Vitals reviewed.         Assessment & Plan:

## 2014-12-11 NOTE — Progress Notes (Signed)
ON RECALL FOR 1 YR FU OV

## 2015-01-14 ENCOUNTER — Encounter: Payer: Self-pay | Admitting: Cardiovascular Disease

## 2015-11-12 ENCOUNTER — Encounter: Payer: Self-pay | Admitting: Gastroenterology

## 2016-01-15 ENCOUNTER — Ambulatory Visit (INDEPENDENT_AMBULATORY_CARE_PROVIDER_SITE_OTHER): Payer: BLUE CROSS/BLUE SHIELD | Admitting: Otolaryngology

## 2016-10-22 ENCOUNTER — Other Ambulatory Visit: Payer: Self-pay | Admitting: Internal Medicine

## 2016-10-22 DIAGNOSIS — E230 Hypopituitarism: Secondary | ICD-10-CM

## 2016-10-25 ENCOUNTER — Encounter: Payer: Self-pay | Admitting: *Deleted

## 2016-10-26 ENCOUNTER — Encounter: Payer: Self-pay | Admitting: Cardiovascular Disease

## 2016-10-26 ENCOUNTER — Encounter: Payer: Self-pay | Admitting: *Deleted

## 2016-10-26 ENCOUNTER — Ambulatory Visit (INDEPENDENT_AMBULATORY_CARE_PROVIDER_SITE_OTHER): Payer: BC Managed Care – PPO | Admitting: Cardiovascular Disease

## 2016-10-26 VITALS — BP 158/84 | HR 84 | Ht 71.0 in | Wt 288.0 lb

## 2016-10-26 DIAGNOSIS — Z713 Dietary counseling and surveillance: Secondary | ICD-10-CM

## 2016-10-26 DIAGNOSIS — R079 Chest pain, unspecified: Secondary | ICD-10-CM

## 2016-10-26 DIAGNOSIS — Q231 Congenital insufficiency of aortic valve: Secondary | ICD-10-CM

## 2016-10-26 DIAGNOSIS — I10 Essential (primary) hypertension: Secondary | ICD-10-CM

## 2016-10-26 MED ORDER — AMLODIPINE BESYLATE 5 MG PO TABS: 5.0000 mg | ORAL_TABLET | Freq: Every day | ORAL | 6 refills | Status: DC

## 2016-10-26 NOTE — Progress Notes (Signed)
CARDIOLOGY CONSULT NOTE  Patient ID: Bryan Austin MRN: [id number] DOB/AGE: 04/11/1961 56 y.o.  Admit date: (Not on file) Primary Physician: Ranae Palms, MD Referring Physician:   Reason for Consultation: chest pain  HPI: The patient is a 56 year old male who presents for evaluation of chest pain. He has a history of hypertension. He does not appear to be on antihypertensive therapy.  Echocardiogram 01/14/15 reportedly demonstrated normal left ventricular systolic function, [id number] [id number], with diastolic dysfunction.   He reportedly had a low risk nuclear stress test at [facility] in January 2016 when he was hospitalized for chest pain.   He was evaluated by [facility] at that time.  He said he has put on over 100 pounds in the past 3 years. He has been having more frequent headaches. He complains of a "dull ache" in the center of his chest which may last for a few minutes. It is not made worse with exertion. He has been taking aspirin occasionally. 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 He has an upcoming MRI of his pituitary gland. He has had problems with leg edema and had been on high-dose Lasix but then stopped it. He notices that his leg edema is improved when he decreases soft drink consumption. Several systolic blood pressure readings have been in the 150s when he has it checked.  ECG performed in the office today shows normal sinus rhythm with RSR prime pattern in V1 and a left posterior fascicular block.    Allergies  Allergen Reactions  . Adhesive [Tape]     RASH, ITCHING    Current Outpatient Prescriptions  Medication Sig Dispense Refill  . aspirin EC 81 MG tablet Take 81 mg by mouth daily.    . colchicine 0.6 MG tablet Take 0.6 mg by mouth daily as needed (gout flare).    Marland Kitchen ibuprofen (ADVIL,MOTRIN) 200 MG tablet Take 200 mg by mouth every 6 (six) hours as needed for headache or mild pain.    .  Suvorexant (BELSOMRA) 15 MG TABS Take 15 mg by mouth as needed.     No current facility-administered medications for this visit.     Past Medical History:  Diagnosis Date  . GERD without esophagitis 2016   NEG CARDIAC EVALUATION [facility]    Past Surgical History:  Procedure Laterality Date  . CHOLECYSTECTOMY  2006   GB FULL OF "SAND", NEEDED A DRAIN DUE TO INFECTION  . COLONOSCOPY  [facility]  . COLONOSCOPY N/A 10/14/2014   Procedure: COLONOSCOPY;  Surgeon: Danie Binder, MD;  [facility];  Service: Endoscopy;  Laterality: N/A;  100pm  . ESOPHAGOGASTRODUODENOSCOPY N/A 10/14/2014   Procedure: ESOPHAGOGASTRODUODENOSCOPY (EGD);  Surgeon: Danie Binder, MD;  [facility];  Service: Endoscopy;  Laterality: N/A;  . HEMORRHOID BANDING N/A 10/14/2014   Procedure: HEMORRHOID BANDING;  Surgeon: Danie Binder, MD;  [facility];  Service: Endoscopy;  Laterality: N/A;  . HERNIA REPAIR  [id number]   COMPLICATED BY PROLONGED ILEUS    Social History   Social History  . Marital status: Married    Spouse name: N/A  . Number of children: N/A  . Years of education: N/A   Occupational History  . Not on file.   Social History Main Topics  . Smoking status: Never Smoker  . Smokeless tobacco: Never Used  . Alcohol use Not on file  . Drug use: Unknown  .  Sexual activity: Not on file   Other Topics Concern  . Not on file   Social History Narrative   Part owner OF [employer]     No family history of premature CAD in 1st degree relatives.  Prior to Admission medications   Medication Sig Start Date End Date Taking? Authorizing Provider  aspirin EC 81 MG tablet Take 81 mg by mouth daily.   Yes Historical Provider, MD  colchicine 0.6 MG tablet Take 0.6 mg by mouth daily as needed (gout flare).   Yes Historical Provider, MD  ibuprofen (ADVIL,MOTRIN) 200 MG tablet Take 200 mg by mouth every 6 (six) hours as needed for headache or mild pain.   Yes Historical  Provider, MD  Suvorexant (BELSOMRA) 15 MG TABS Take 15 mg by mouth as needed.   Yes Historical Provider, MD     Review of systems complete and found to be negative unless listed above in HPI     Physical exam Blood pressure (!) 158/84, pulse 84, height 5\' 11"  (1.803 m), weight 288 lb (130.6 kg), SpO2 97 %. General: NAD Neck: No JVD, no thyromegaly or thyroid nodule.  Lungs: Clear to auscultation bilaterally with normal respiratory effort. CV: Nondisplaced PMI. Regular rate and rhythm, normal S1/S2, no [id number], 2/6 systolic murmur loudest over RUSB.  Trace b/l pretibial edema.  No carotid bruit.  Abdomen: Obese.  Skin: Intact without lesions or rashes.  Neurologic: Alert and oriented x 3.  Psych: Normal affect. Extremities: No clubbing or cyanosis.  HEENT: Normal.   ECG: Most recent ECG reviewed.  Telemetry: Independently reviewed.  Labs:  No results found for: WBC, HGB, HCT, MCV, PLT No results for input(s): NA, K, CL, CO2, BUN, CREATININE, CALCIUM, PROT, BILITOT, ALKPHOS, ALT, AST, GLUCOSE in the last 168 hours.  Invalid input(s): LABALBU No results found for: CKTOTAL, CKMB, CKMBINDEX, TROPONINI No results found for: CHOL No results found for: HDL No results found for: LDLCALC No results found for: TRIG No results found for: CHOLHDL No results found for: LDLDIRECT       Studies: No results found.  ASSESSMENT AND PLAN:  1. Chest pain: I will obtain a copy of his nuclear stress test from January 2016. I will obtain an echocardiogram to evaluate cardiac structure and function. There was question of a bicuspid aortic valve which I will evaluate. Echocardiography should be performed with contrast enhancement.  2. Essential HTN: I will start amlodipine 5 mg daily. I will order an echocardiogram to evaluate cardiac structure and function.  3. Weight loss encouraged.  4. Bicuspid aortic valve:  I will obtain an echocardiogram to evaluate cardiac structure and function. There  was question of a bicuspid aortic valve which I will evaluate. Echocardiography should be performed with contrast enhancement.  Dispo: fu 3 months.   Signed: Kate Sable, M.D., F.A.C.C.  10/26/2016, 1:36 PM

## 2016-10-26 NOTE — Patient Instructions (Signed)
Medication Instructions:   Begin Amlodipine 5mg  daily.    Continue all other medications.    Labwork: none  Testing/Procedures:  Your physician has requested that you have an echocardiogram. Echocardiography is a painless test that uses sound waves to create images of your heart. It provides your doctor with information about the size and shape of your heart and how well your heart's chambers and valves are working. This procedure takes approximately one hour. There are no restrictions for this procedure.  (echo with contrast)  Office will contact with results via phone or letter.    Follow-Up: 3 months   Any Other Special Instructions Will Be Listed Below (If Applicable).  If you need a refill on your cardiac medications before your next appointment, please call your pharmacy.

## 2016-11-04 ENCOUNTER — Other Ambulatory Visit: Payer: BLUE CROSS/BLUE SHIELD

## 2016-11-07 ENCOUNTER — Ambulatory Visit
Admission: RE | Admit: 2016-11-07 | Discharge: 2016-11-07 | Disposition: A | Discharge status: Home / Self Care | Payer: BC Managed Care – PPO | Source: Ambulatory Visit | Attending: Internal Medicine | Admitting: Internal Medicine

## 2016-11-07 DIAGNOSIS — E230 Hypopituitarism: Secondary | ICD-10-CM

## 2016-11-16 ENCOUNTER — Ambulatory Visit (HOSPITAL_COMMUNITY)
Admission: RE | Admit: 2016-11-16 | Discharge: 2016-11-16 | Disposition: A | Discharge status: Home / Self Care | Payer: BC Managed Care – PPO | Source: Ambulatory Visit | Attending: Cardiovascular Disease | Admitting: Cardiovascular Disease

## 2016-11-16 DIAGNOSIS — R079 Chest pain, unspecified: Secondary | ICD-10-CM | POA: Diagnosis present

## 2016-11-16 DIAGNOSIS — I10 Essential (primary) hypertension: Secondary | ICD-10-CM | POA: Diagnosis not present

## 2016-11-16 DIAGNOSIS — K219 Gastro-esophageal reflux disease without esophagitis: Secondary | ICD-10-CM | POA: Diagnosis not present

## 2016-11-16 MED ORDER — PERFLUTREN LIPID MICROSPHERE
1.0000 mL | INTRAVENOUS | Status: AC | PRN
  Administered 2016-11-16: 2 mL via INTRAVENOUS
  Filled 2016-11-16: qty 10

## 2016-11-16 NOTE — Progress Notes (Signed)
*  PRELIMINARY RESULTS* Echocardiogram 2D Echocardiogram with definity has been performed.  Leavy Cella 11/16/2016, 9:48 AM

## 2016-12-11 ENCOUNTER — Other Ambulatory Visit: Payer: BC Managed Care – PPO

## 2017-01-26 ENCOUNTER — Telehealth: Payer: Self-pay

## 2017-01-26 ENCOUNTER — Telehealth: Payer: Self-pay | Admitting: Cardiovascular Disease

## 2017-01-26 ENCOUNTER — Ambulatory Visit (INDEPENDENT_AMBULATORY_CARE_PROVIDER_SITE_OTHER): Payer: BC Managed Care – PPO | Admitting: Cardiovascular Disease

## 2017-01-26 ENCOUNTER — Encounter: Payer: Self-pay | Admitting: Cardiovascular Disease

## 2017-01-26 VITALS — BP 161/80 | HR 77 | Ht 71.0 in | Wt 290.0 lb

## 2017-01-26 DIAGNOSIS — Q231 Congenital insufficiency of aortic valve: Secondary | ICD-10-CM | POA: Diagnosis not present

## 2017-01-26 DIAGNOSIS — Z713 Dietary counseling and surveillance: Secondary | ICD-10-CM

## 2017-01-26 DIAGNOSIS — Z9114 Patient's other noncompliance with medication regimen: Secondary | ICD-10-CM | POA: Diagnosis not present

## 2017-01-26 DIAGNOSIS — I10 Essential (primary) hypertension: Secondary | ICD-10-CM

## 2017-01-26 DIAGNOSIS — R079 Chest pain, unspecified: Secondary | ICD-10-CM | POA: Diagnosis not present

## 2017-01-26 NOTE — Telephone Encounter (Signed)
Pre-cert Verification for the following procedure    CT ANGIO CHEST AORTA W/CM scheduled for 02/04/17 at [facility].

## 2017-01-26 NOTE — Patient Instructions (Signed)
Medication Instructions:  Your physician recommends that you continue on your current medications as directed. Please refer to the Current Medication list given to you today.  Labwork: BMET  Testing/Procedures: Non-Cardiac CT Angiography (CTA), is a special type of CT scan that uses a computer to produce multi-dimensional views of major blood vessels throughout the body. In CT angiography, a contrast material is injected through an IV to help visualize the blood vessels  Follow-Up: Your physician wants you to follow-up in: [place]. You will receive a reminder letter in the mail two months in advance. If you don't receive a letter, please call our office to schedule the follow-up appointment.  Any Other Special Instructions Will Be Listed Below (If Applicable).  If you need a refill on your cardiac medications before your next appointment, please call your pharmacy.

## 2017-01-26 NOTE — Telephone Encounter (Signed)
-----   Message from Herminio Commons, MD sent at 01/26/2017 12:03 PM EDT ----- [place].

## 2017-01-26 NOTE — Progress Notes (Signed)
SUBJECTIVE: The patient returns for follow-up after undergoing cardiovascular testing performed for the evaluation of chest pain and bicuspid aortic valve.  Echocardiogram was performed on 11/16/16. The aortic valve was poorly visualized with an uncertain number of leaflets. There was no stenosis and no significant regurgitation. Left ventricular systolic function was normal as was regional wall motion and diastolic function, LVEF 12-75%. There was mild LVH.  He reportedly had a low risk nuclear stress test at [facility] in January 2016 when he was hospitalized for chest pain.   He currently denies chest pain. He has stable exertional dyspnea. He is morbidly obese.  He has not taken the amlodipine I prescribed for at least a month. His blood pressure is 161/80. He has also not been exercising.   Review of Systems: As per "subjective", otherwise negative.  Allergies  Allergen Reactions  . Adhesive [Tape]     RASH, ITCHING    Current Outpatient Prescriptions  Medication Sig Dispense Refill  . amLODipine (NORVASC) 5 MG tablet Take 1 tablet (5 mg total) by mouth daily. (Patient not taking: Reported on 01/26/2017) 30 tablet 6  . aspirin EC 81 MG tablet Take 81 mg by mouth daily.     No current facility-administered medications for this visit.     Past Medical History:  Diagnosis Date  . GERD without esophagitis 2016   NEG CARDIAC EVALUATION [facility]    Past Surgical History:  Procedure Laterality Date  . CHOLECYSTECTOMY  2006   GB FULL OF "SAND", NEEDED A DRAIN DUE TO INFECTION  . COLONOSCOPY  [facility]  . COLONOSCOPY N/A 10/14/2014   Procedure: COLONOSCOPY;  Surgeon: Danie Binder, MD;  [facility];  Service: Endoscopy;  Laterality: N/A;  100pm  . ESOPHAGOGASTRODUODENOSCOPY N/A 10/14/2014   Procedure: ESOPHAGOGASTRODUODENOSCOPY (EGD);  Surgeon: Danie Binder, MD;  [facility];  Service: Endoscopy;  Laterality: N/A;  . HEMORRHOID BANDING N/A 10/14/2014     Procedure: HEMORRHOID BANDING;  Surgeon: Danie Binder, MD;  [facility];  Service: Endoscopy;  Laterality: N/A;  . HERNIA REPAIR  1700   COMPLICATED BY PROLONGED ILEUS    Social History   Social History  . Marital status: Married    Spouse name: N/A  . Number of children: N/A  . Years of education: N/A   Occupational History  . Not on file.   Social History Main Topics  . Smoking status: Never Smoker  . Smokeless tobacco: Never Used  . Alcohol use Not on file  . Drug use: Unknown  . Sexual activity: Not on file   Other Topics Concern  . Not on file   Social History Narrative   Part owner OF [employer]     Vitals:   01/26/17 0825  BP: (!) 161/80  Pulse: 77  SpO2: 96%  Weight: 290 lb (131.5 kg)  Height: 5\' 11"  (1.803 m)    Wt Readings from Last 3 Encounters:  01/26/17 290 lb (131.5 kg)  10/26/16 288 lb (130.6 kg)  12/11/14 261 lb 6.4 oz (118.6 kg)     PHYSICAL EXAM General: NAD HEENT: Normal. Neck: No JVD, no thyromegaly. Lungs: Clear to auscultation bilaterally with normal respiratory effort. CV: Nondisplaced PMI. Regular rate and rhythm, normal S1/S2, no F7/C9, 2/6 systolic murmur loudest over RUSB.  Trace b/l pretibial edema.  No carotid bruit.  Abdomen: Obese.  Neurologic: Alert and oriented.  Psych: Normal affect. Skin: Normal. Musculoskeletal: No gross deformities.  ECG: Most recent ECG reviewed.   Labs: No results found for: K, BUN, CREATININE, ALT, TSH, HGB   Lipids: No results found for: LDLCALC, LDLDIRECT, CHOL, TRIG, HDL     ASSESSMENT AND PLAN:  1. Chest pain: Symptomatically stable. Encouraged to take amlodipine as prescribed. Given the possibility of a bicuspid aortic valve, I will obtain a CT angiogram of the aorta to evaluate for aortopathy.  2. Essential HTN: Markedly elevated. I strongly advised him to take amlodipine 5 mg daily. He has been medically noncompliant for at least a month.  3. Morbid  obesity: Weight loss encouraged.  4. Bicuspid aortic valve:  Unable to visualize aortic valve well as detailed above. There was no significant stenosis nor regurgitation. I will monitor. Given the possibility of a bicuspid aortic valve, I will obtain a CT angiogram of the aorta to evaluate for aortopathy.     Disposition: Follow up 6 months.  Kate Sable, M.D., F.A.C.C.

## 2017-01-26 NOTE — Telephone Encounter (Signed)
PATIENT NOTIFIED. ROUTED TO PCP

## 2017-02-04 ENCOUNTER — Telehealth: Payer: Self-pay | Admitting: *Deleted

## 2017-02-04 ENCOUNTER — Ambulatory Visit (HOSPITAL_COMMUNITY)
Admission: RE | Admit: 2017-02-04 | Discharge: 2017-02-04 | Disposition: A | Discharge status: Home / Self Care | Payer: BC Managed Care – PPO | Source: Ambulatory Visit | Attending: Cardiovascular Disease | Admitting: Cardiovascular Disease

## 2017-02-04 DIAGNOSIS — Q231 Congenital insufficiency of aortic valve: Secondary | ICD-10-CM | POA: Diagnosis present

## 2017-02-04 MED ORDER — IOPAMIDOL (ISOVUE-370) INJECTION 76%
100.0000 mL | Freq: Once | INTRAVENOUS | Status: AC | PRN
  Administered 2017-02-04: 100 mL via INTRAVENOUS

## 2017-02-04 NOTE — Telephone Encounter (Signed)
Notes recorded by Laurine Blazer, LPN on 2/35/5732 at 2:02 PM EDT Patient notified. Copy to pmd. ------  Notes recorded by Herminio Commons, MD on 02/04/2017 at 10:22 AM EDT No aneurysm. Repeat in one year.

## 2017-02-13 ENCOUNTER — Encounter (HOSPITAL_COMMUNITY): Payer: Self-pay | Admitting: Emergency Medicine

## 2017-02-13 ENCOUNTER — Ambulatory Visit (HOSPITAL_COMMUNITY)
Admission: EM | Admit: 2017-02-13 | Discharge: 2017-02-13 | Disposition: A | Discharge status: Home / Self Care | Payer: BC Managed Care – PPO | Attending: Family Medicine | Admitting: Family Medicine

## 2017-02-13 DIAGNOSIS — R062 Wheezing: Secondary | ICD-10-CM | POA: Diagnosis not present

## 2017-02-13 DIAGNOSIS — R05 Cough: Secondary | ICD-10-CM

## 2017-02-13 DIAGNOSIS — R0602 Shortness of breath: Secondary | ICD-10-CM | POA: Diagnosis not present

## 2017-02-13 DIAGNOSIS — J208 Acute bronchitis due to other specified organisms: Secondary | ICD-10-CM | POA: Diagnosis not present

## 2017-02-13 MED ORDER — ALBUTEROL SULFATE HFA 108 (90 BASE) MCG/ACT IN AERS: 1.0000 | INHALATION_SPRAY | Freq: Four times a day (QID) | RESPIRATORY_TRACT | 0 refills | Status: DC | PRN

## 2017-02-13 MED ORDER — METHYLPREDNISOLONE 4 MG PO TBPK: ORAL_TABLET | ORAL | 0 refills | Status: DC

## 2017-02-13 MED ORDER — ALBUTEROL SULFATE (2.5 MG/3ML) 0.083% IN NEBU
2.5000 mg | INHALATION_SOLUTION | Freq: Once | RESPIRATORY_TRACT | Status: AC
  Administered 2017-02-13: 2.5 mg via RESPIRATORY_TRACT

## 2017-02-13 MED ORDER — BENZONATATE 100 MG PO CAPS: 200.0000 mg | ORAL_CAPSULE | Freq: Three times a day (TID) | ORAL | 0 refills | Status: DC | PRN

## 2017-02-13 MED ORDER — ALBUTEROL SULFATE (2.5 MG/3ML) 0.083% IN NEBU
INHALATION_SOLUTION | RESPIRATORY_TRACT | Status: AC
  Filled 2017-02-13: qty 3

## 2017-02-13 MED ORDER — AZITHROMYCIN 250 MG PO TABS: 250.0000 mg | ORAL_TABLET | Freq: Every day | ORAL | 0 refills | Status: DC

## 2017-02-13 NOTE — ED Provider Notes (Signed)
CSN: [id number]     Arrival date & time 02/13/17  1843 History   None    Chief Complaint  Patient presents with  . Cough   (Consider location/radiation/quality/duration/timing/severity/associated sxs/prior Treatment) Patient c/o cough which is worsening.  He c/o SOB and wheezing.   The history is provided by the patient.  Cough  Cough characteristics:  Productive Sputum characteristics:  White Severity:  Moderate Onset quality:  Sudden Duration:  4 weeks Timing:  Constant Progression:  Worsening Chronicity:  New Smoker: no   Relieved by:  Nothing Associated symptoms: wheezing     Past Medical History:  Diagnosis Date  . GERD without esophagitis 2016   NEG CARDIAC EVALUATION [facility]   Past Surgical History:  Procedure Laterality Date  . CHOLECYSTECTOMY  2006   GB FULL OF "SAND", NEEDED A DRAIN DUE TO INFECTION  . COLONOSCOPY  [facility]  . COLONOSCOPY N/A 10/14/2014   Procedure: COLONOSCOPY;  Surgeon: Danie Binder, MD;  [facility];  Service: Endoscopy;  Laterality: N/A;  100pm  . ESOPHAGOGASTRODUODENOSCOPY N/A 10/14/2014   Procedure: ESOPHAGOGASTRODUODENOSCOPY (EGD);  Surgeon: Danie Binder, MD;  [facility];  Service: Endoscopy;  Laterality: N/A;  . HEMORRHOID BANDING N/A 10/14/2014   Procedure: HEMORRHOID BANDING;  Surgeon: Danie Binder, MD;  [facility];  Service: Endoscopy;  Laterality: N/A;  . HERNIA REPAIR  5366   COMPLICATED BY PROLONGED ILEUS   Family History  Problem Relation Age of Onset  . Colon polyps Mother   . Kidney disease Mother   . Colon cancer Maternal Grandmother    Social History  Substance Use Topics  . Smoking status: Never Smoker  . Smokeless tobacco: Never Used  . Alcohol use Not on file    Review of Systems  Constitutional: Negative.   HENT: Negative.   Eyes: Negative.   Respiratory: Positive for cough and wheezing.   Cardiovascular: Negative.   Gastrointestinal: Negative.   Endocrine: Negative.    Genitourinary: Negative.   Musculoskeletal: Negative.   Skin: Negative.   Allergic/Immunologic: Negative.   Neurological: Negative.   Hematological: Negative.   Psychiatric/Behavioral: Negative.     Allergies  Adhesive [tape]  Home Medications   Prior to Admission medications   Medication Sig Start Date End Date Taking? Authorizing Provider  amLODipine (NORVASC) 5 MG tablet Take 1 tablet (5 mg total) by mouth daily. 10/26/16  Yes Herminio Commons, MD  aspirin EC 81 MG tablet Take 81 mg by mouth daily.   Yes [provider]  colchicine 0.6 MG tablet Take 0.6 mg by mouth daily.   Yes [provider]  albuterol (PROVENTIL HFA;VENTOLIN HFA) 108 (90 Base) MCG/ACT inhaler Inhale 1-2 puffs into the lungs every 6 (six) hours as needed for wheezing or shortness of breath. 02/13/17   Lysbeth Penner, FNP  azithromycin (ZITHROMAX) 250 MG tablet Take 1 tablet (250 mg total) by mouth daily. Take first 2 tablets together, then 1 every day until finished. 02/13/17   Lysbeth Penner, FNP  benzonatate (TESSALON) 100 MG capsule Take 2 capsules (200 mg total) by mouth 3 (three) times daily as needed for cough. 02/13/17   Lysbeth Penner, FNP  methylPREDNISolone (MEDROL DOSEPAK) 4 MG TBPK tablet Take 6-5-4-3-2-1 po qd 02/13/17   Lysbeth Penner, FNP   Meds Ordered and Administered this Visit   Medications  albuterol (PROVENTIL) (2.5 MG/3ML) 0.083% nebulizer solution 2.5 mg (2.5 mg Nebulization Given 02/13/17 1937)    BP Marland Kitchen)  158/89 (BP Location: Right Arm) Comment: reported BP to nurse Christina Soto  Pulse 92   Temp 98.7 F (37.1 C) (Oral)   Resp 18   SpO2 97%  No data found.   Physical Exam  Constitutional: He appears well-developed and well-nourished.  HENT:  Head: Normocephalic and atraumatic.  Eyes: Conjunctivae and EOM are normal. Pupils are equal, round, and reactive to light.  Neck: Normal range of motion.  Cardiovascular: Normal rate, regular rhythm and  normal heart sounds.   Pulmonary/Chest: Effort normal. He has wheezes.  Abdominal: Soft. Bowel sounds are normal.  Nursing note and vitals reviewed.   Urgent Care Course     Procedures (including critical care time)  Labs Review Labs Reviewed - No data to display  Imaging Review No results found.   Visual Acuity Review  Right Eye Distance:   Left Eye Distance:   Bilateral Distance:    Right Eye Near:   Left Eye Near:    Bilateral Near:         MDM   1. Acute bronchitis due to other specified organisms    Neb Treatment  Albuterol MDI Medrol dose pack as directed 4mg  #21 Zpak Tessalon Perles 200mg  one po tid prn   Push po fluids, rest, tylenol and motrin otc prn as directed for fever, arthralgias, and myalgias.  Follow up prn if sx's continue or persist.    Lysbeth Penner, FNP 02/13/17 2020

## 2017-02-13 NOTE — ED Triage Notes (Signed)
The patient presented to the [facility] with a complaint of a cough x 1 month. The patient also reported SOB upon walking and increased lower leg edema.

## 2017-08-09 ENCOUNTER — Ambulatory Visit: Payer: BC Managed Care – PPO | Admitting: Cardiovascular Disease

## 2017-08-09 ENCOUNTER — Encounter: Payer: Self-pay | Admitting: Cardiovascular Disease

## 2017-08-09 VITALS — BP 142/86 | HR 89 | Ht 71.0 in | Wt 284.0 lb

## 2017-08-09 DIAGNOSIS — R002 Palpitations: Secondary | ICD-10-CM

## 2017-08-09 DIAGNOSIS — R079 Chest pain, unspecified: Secondary | ICD-10-CM

## 2017-08-09 DIAGNOSIS — Q231 Congenital insufficiency of aortic valve: Secondary | ICD-10-CM

## 2017-08-09 DIAGNOSIS — I10 Essential (primary) hypertension: Secondary | ICD-10-CM | POA: Diagnosis not present

## 2017-08-09 NOTE — Patient Instructions (Signed)

## 2017-08-09 NOTE — Progress Notes (Signed)
SUBJECTIVE: Patient presents for follow-up of chest pain and possible bicuspid aortic valve.  He is noncompliant with medical therapy.  CT angiography of the chest did not demonstrate any aortic valve abnormalities nor any thoracic aortic aneurysm.  Echocardiogram was performed on 11/16/16. The aortic valve was poorly visualized with an uncertain number of leaflets. There was no stenosis and no significant regurgitation. Left ventricular systolic function was normal as was regional wall motion and diastolic function, LVEF 84-13%. There was mild LVH.  He reportedly had a low risk nuclear stress test at [facility] in January 2016 when he was hospitalized for chest pain.   Blood pressure remains elevated with medication noncompliance.  He said he took amlodipine for a while and then stopped it due to leg swelling.  His PCP prescribed long-acting diltiazem which she also took for about a month and then stopped due to leg swelling.  He said systolic blood pressures have frequently been below 140.  He denies chest pain.  It the other day while sitting at his PCPs office, he experienced about 15 minutes of palpitations.  This was an isolated event and was not associated with shortness of breath, chest pain, or dizziness.  He said he is chronically short of breath due to being overweight.   Review of Systems: As per "subjective", otherwise negative.  Allergies  Allergen Reactions  . Adhesive [Tape]     RASH, ITCHING    Current Outpatient Medications  Medication Sig Dispense Refill  . colchicine 0.6 MG tablet Take 0.6 mg by mouth daily.     No current facility-administered medications for this visit.     Past Medical History:  Diagnosis Date  . GERD without esophagitis 2016   NEG CARDIAC EVALUATION [facility]    Past Surgical History:  Procedure Laterality Date  . CHOLECYSTECTOMY  2006   GB FULL OF "SAND", NEEDED A DRAIN DUE TO INFECTION  . COLONOSCOPY  [facility]  . COLONOSCOPY  N/A 10/14/2014   Procedure: COLONOSCOPY;  Surgeon: Danie Binder, MD;  [facility];  Service: Endoscopy;  Laterality: N/A;  100pm  . ESOPHAGOGASTRODUODENOSCOPY N/A 10/14/2014   Procedure: ESOPHAGOGASTRODUODENOSCOPY (EGD);  Surgeon: Danie Binder, MD;  [facility];  Service: Endoscopy;  Laterality: N/A;  . HEMORRHOID BANDING N/A 10/14/2014   Procedure: HEMORRHOID BANDING;  Surgeon: Danie Binder, MD;  [facility];  Service: Endoscopy;  Laterality: N/A;  . HERNIA REPAIR  2440   COMPLICATED BY PROLONGED ILEUS    Social History   Socioeconomic History  . Marital status: Married    Spouse name: Not on file  . Number of children: Not on file  . Years of education: Not on file  . Highest education level: Not on file  Social Needs  . Financial resource strain: Not on file  . Food insecurity - worry: Not on file  . Food insecurity - inability: Not on file  . Transportation needs - medical: Not on file  . Transportation needs - non-medical: Not on file  Occupational History  . Not on file  Tobacco Use  . Smoking status: Never Smoker  . Smokeless tobacco: Never Used  Substance and Sexual Activity  . Alcohol use: Not on file  . Drug use: Not on file  . Sexual activity: Not on file  Other Topics Concern  . Not on file  Social History Narrative   Part owner OF [employer]     Vitals:  08/09/17 1252  BP: (!) 142/86  Pulse: 89  SpO2: 95%  Weight: 284 lb (128.8 kg)  Height: 5\' 11"  (1.803 m)    Wt Readings from Last 3 Encounters:  08/09/17 284 lb (128.8 kg)  01/26/17 290 lb (131.5 kg)  10/26/16 288 lb (130.6 kg)     PHYSICAL EXAM General: NAD HEENT: Normal. Neck: No JVD, no thyromegaly. Lungs: Clear to auscultation bilaterally with normal respiratory effort. CV: Regular rate and rhythm, normal S1/S2, no T3/M4,6/8 systolicmurmur loudest over RUSB. No pretibial edema. No carotid bruits. Abdomen: Soft, protuberant. Neurologic:  Alert and oriented.  Psych: Normal affect. Skin: Normal. Musculoskeletal: No gross deformities.    ECG: Most recent ECG reviewed.   Labs: No results found for: K, BUN, CREATININE, ALT, TSH, HGB   Lipids: No results found for: LDLCALC, LDLDIRECT, CHOL, TRIG, HDL     ASSESSMENT AND PLAN:  1. Chest pain:Symptomatically stable. Repeatedly encouraged to take amlodipine as prescribed. No aortopathy by chest CT in June 2018 as detailed above.  2. Essential HTN: Mildly elevated.  He did not take the amlodipine as previously prescribed and remains noncompliant.  He says systolic blood pressures frequently run below 140.  3. Morbid obesity: Weight loss encouraged.  4. Bicuspid aortic valve: Unable to visualize aortic valve well as detailed above. There was no significant stenosis nor regurgitation. I will monitor. No aortopathy by chest CT in June 2018 as detailed above.  5.  Palpitations: This was an isolated event.  If he has frequent recurrences, I would obtain an event monitor.    Disposition: Follow up 1 year   Kate Sable, M.D., F.A.C.C.

## 2018-03-16 ENCOUNTER — Telehealth: Payer: Self-pay | Admitting: Cardiovascular Disease

## 2018-03-16 NOTE — Telephone Encounter (Signed)
Would change to first available appt either with Dr Raliegh Ip or PA in [place], whichever is earlier. If severe symptoms needs ER evaluation   Zandra Abts MD

## 2018-03-16 NOTE — Telephone Encounter (Signed)
Chest pressure on right side - been researching on Internet and thinks that he needs to see Dr Bronson Ing before Dec 2019

## 2018-03-16 NOTE — Telephone Encounter (Signed)
LM to return call.

## 2018-03-16 NOTE — Telephone Encounter (Signed)
Pt c/o L sided chest pressure for the last 2 weeks - palpitations off and on - some fluid in hands and feet - says he was given lasix for this and it has gotten some better but says pcp says he had fluid around heart and lungs - denies any weight gain - some SOB when laying down - taking lasix 40 mg daily - wanted to know if he should be seen before December

## 2018-03-16 NOTE — Telephone Encounter (Signed)
Pt aware and scheduled with Estella Husk, PA 8/16 @ 11:30am

## 2018-04-05 DIAGNOSIS — I10 Essential (primary) hypertension: Secondary | ICD-10-CM | POA: Insufficient documentation

## 2018-04-05 DIAGNOSIS — I359 Nonrheumatic aortic valve disorder, unspecified: Secondary | ICD-10-CM | POA: Insufficient documentation

## 2018-04-05 NOTE — Progress Notes (Signed)
Cardiology Office Note    Date:  04/10/2018   ID:  Bryan Austin, DOB 1960/09/02, MRN [id number]  PCP:  Ranae Palms, MD  Cardiologist: Kate Sable, MD  Chief Complaint  Patient presents with  . Chest Pain  . Leg Swelling    History of Present Illness:  Bryan Austin is a 57 y.o. male with history of hypertension, possible bicuspid aortic valve, low risk nuclear stress test at [facility] 08/2014 for chest pain, history of medical noncompliance.  No aortopathy on chest CT 01/2017.  A couple of months ago he developed left chest pressure-comes and goes-can last 6 hrs. Exertion doesn't make it worse. No radiation or associated shortness of breath.  Has not strained himself in any way.  No long road trips.  Was having dyspnea on exertion and leg swelling. Was put on lasix and it mostly resolved. Lost 8 lbs of fluid overnight but it builds back up every day.  He takes his Lasix at night.  Insurance company denied having echo at [facility].  Has gained over 110 pounds in 5 years.  Stopped exercising.    Past Medical History:  Diagnosis Date  . GERD without esophagitis 2016   NEG CARDIAC EVALUATION [facility]    Past Surgical History:  Procedure Laterality Date  . CHOLECYSTECTOMY  2006   GB FULL OF "SAND", NEEDED A DRAIN DUE TO INFECTION  . COLONOSCOPY  [facility]  . COLONOSCOPY N/A 10/14/2014   Procedure: COLONOSCOPY;  Surgeon: Danie Binder, MD;  [facility];  Service: Endoscopy;  Laterality: N/A;  100pm  . ESOPHAGOGASTRODUODENOSCOPY N/A 10/14/2014   Procedure: ESOPHAGOGASTRODUODENOSCOPY (EGD);  Surgeon: Danie Binder, MD;  [facility];  Service: Endoscopy;  Laterality: N/A;  . HEMORRHOID BANDING N/A 10/14/2014   Procedure: HEMORRHOID BANDING;  Surgeon: Danie Binder, MD;  [facility];  Service: Endoscopy;  Laterality: N/A;  . HERNIA REPAIR  9030   COMPLICATED BY PROLONGED ILEUS    Current Medications: Current Meds  Medication Sig  .  aspirin EC 81 MG tablet Take 81 mg by mouth daily.  . colchicine 0.6 MG tablet Take 0.6 mg by mouth daily as needed.  . furosemide (LASIX) 40 MG tablet Take 40 mg by mouth daily.  . Melatonin 5 MG TABS Take 5 mg by mouth at bedtime.     Allergies:   Adhesive [tape]   Social History   Socioeconomic History  . Marital status: Married    Spouse name: Not on file  . Number of children: Not on file  . Years of education: Not on file  . Highest education level: Not on file  Occupational History  . Not on file  Social Needs  . Financial resource strain: Not on file  . Food insecurity:    Worry: Not on file    Inability: Not on file  . Transportation needs:    Medical: Not on file    Non-medical: Not on file  Tobacco Use  . Smoking status: Never Smoker  . Smokeless tobacco: Never Used  Substance and Sexual Activity  . Alcohol use: Not on file  . Drug use: Not on file  . Sexual activity: Not on file  Lifestyle  . Physical activity:    Days per week: Not on file    Minutes per session: Not on file  . Stress: Not on file  Relationships  . Social connections:    Talks on phone: Not on file  Gets together: Not on file    Attends religious service: Not on file    Active member of club or organization: Not on file    Attends meetings of clubs or organizations: Not on file    Relationship status: Not on file  Other Topics Concern  . Not on file  Social History Narrative   Part owner OF [employer]     Family History:  The patient's family history includes Colon cancer in his maternal grandmother; Colon polyps in his mother; Kidney disease in his mother.   ROS:   Please see the history of present illness.    Review of Systems  Constitution: Negative.  HENT: Negative.   Cardiovascular: Positive for chest pain, dyspnea on exertion and leg swelling.  Respiratory: Negative.   Endocrine: Negative.   Hematologic/Lymphatic: Negative.   Musculoskeletal: Negative.     Gastrointestinal: Negative.   Genitourinary: Negative.   Neurological: Negative.    All other systems reviewed and are negative.   PHYSICAL EXAM:   VS:  BP (!) 142/86   Pulse 90   Ht '5\' 11"'$  (1.803 m)   Wt 299 lb (135.6 kg)   SpO2 96%   BMI 41.70 kg/m   Physical Exam  GEN: Obese, in no acute distress  Neck: no JVD, carotid bruits, or masses Cardiac:RRR; positive S4, 1/6 systolic murmur the left sternal border Respiratory: Decreased breath sounds at the left lung base otherwise clear GI: soft, nontender, nondistended, + BS Ext: +1 edema bilaterally without cyanosis, clubbing,  Good distal pulses bilaterally Neuro:  Alert and Oriented x 3 Psych: euthymic mood, full affect  Wt Readings from Last 3 Encounters:  04/10/18 299 lb (135.6 kg)  08/09/17 284 lb (128.8 kg)  01/26/17 290 lb (131.5 kg)      Studies/Labs Reviewed:   EKG:  EKG is ordered today.  The ekg ordered today demonstrates normal sinus rhythm, nonspecific ST-T wave changes no acute change  Recent Labs: No results found for requested labs within last 8760 hours.   Lipid Panel No results found for: CHOL, TRIG, HDL, CHOLHDL, VLDL, LDLCALC, LDLDIRECT  Additional studies/ records that were reviewed today include:  2D echo 11/16/2016 Study Conclusions   - Left ventricle: The cavity size was normal. Wall thickness was   increased in a pattern of mild LVH. Systolic function was normal.   The estimated ejection fraction was in the range of 60% to 65%.   Wall motion was normal; there were no regional wall motion   abnormalities. Left ventricular diastolic function parameters   were normal. - Aortic valve: Valve area (VTI): 1.99 cm^2. Valve area (Vmax):   1.73 cm^2. - Atrial septum: No defect or patent foramen ovale was identified. - Technically difficult study. Echocontrast was used to enhance   visualization.   Chest CT 6/2018FINDINGS: Cardiovascular: Preferential opacification of the thoracic aorta.  No evidence of thoracic aortic aneurysm or dissection. Normal heart size. No pericardial effusion. No definite abnormality seen in the aortic valve.   Mediastinum/Nodes: No enlarged mediastinal, hilar, or axillary lymph nodes. Thyroid gland, trachea, and esophagus demonstrate no significant findings.   Lungs/Pleura: Lungs are clear. No pleural effusion or pneumothorax.   Upper Abdomen: No acute abnormality.   Musculoskeletal: No chest wall abnormality. No acute or significant osseous findings.   Review of the MIP images confirms the above findings.   IMPRESSION: No definite abnormality seen in the chest.     Electronically Signed   By: Marijo Conception, M.D.  On: 02/04/2017 10:16      ASSESSMENT:    1. Diastolic CHF, chronic (HCC)   2. Chest pain, unspecified type   3. Essential hypertension   4. Aortic valve disorder   5. Morbid obesity ([place])      PLAN:  In order of problems listed above:  Suspect diastolic CHF secondary to untreated hypertension, obesity and eating out.  Continue Lasix at current dose.  Check 2D echo to assess LV function and diastolic function.  Was normal 10/2016.  Lasix has helped some.  Patient not taking potassium with the Lasix just because he decided not to.  We will check be met today and BNP.  Chest pain described as a pressure that lasts up to 6 hours comes and goes over the past 6 months.  Will order exercise Myoview to rule out ischemia.  Essential hypertension blood pressure running high.  Used to be on amlodipine and diltiazem but they caused leg swelling.  Not on anything at this time.  Will start lisinopril 10 mg once daily.   Aortic valve disorder with question of bicuspid aortic valve poorly visualized on echo in the past.  CT of the chest 01/2017 no aortic valve disease.  Obesity weight loss program recommended.  He had gotten down to 180 pounds.  But is gained over 110 pounds in the past 5 years.   Medication Adjustments/Labs  and Tests Ordered: Current medicines are reviewed at length with the patient today.  Concerns regarding medicines are outlined above.  Medication changes, Labs and Tests ordered today are listed in the Patient Instructions below. There are no Patient Instructions on file for this visit.   Sumner Boast, PA-C  04/10/2018 12:23 PM    [facility] [address], [place], [place]  [address] Phone: [phone]; Fax: [phone]

## 2018-04-10 ENCOUNTER — Ambulatory Visit: Payer: BC Managed Care – PPO | Admitting: Physician Assistant

## 2018-04-10 ENCOUNTER — Encounter: Payer: Self-pay | Admitting: Physician Assistant

## 2018-04-10 ENCOUNTER — Other Ambulatory Visit (HOSPITAL_COMMUNITY)
Admission: RE | Admit: 2018-04-10 | Discharge: 2018-04-10 | Disposition: A | Discharge status: Home / Self Care | Payer: BC Managed Care – PPO | Source: Ambulatory Visit | Attending: Physician Assistant | Admitting: Physician Assistant

## 2018-04-10 ENCOUNTER — Encounter: Payer: Self-pay | Admitting: *Deleted

## 2018-04-10 VITALS — BP 142/86 | HR 90 | Ht 71.0 in | Wt 299.0 lb

## 2018-04-10 DIAGNOSIS — I359 Nonrheumatic aortic valve disorder, unspecified: Secondary | ICD-10-CM | POA: Diagnosis not present

## 2018-04-10 DIAGNOSIS — R079 Chest pain, unspecified: Secondary | ICD-10-CM | POA: Diagnosis not present

## 2018-04-10 DIAGNOSIS — I5032 Chronic diastolic (congestive) heart failure: Secondary | ICD-10-CM

## 2018-04-10 DIAGNOSIS — I10 Essential (primary) hypertension: Secondary | ICD-10-CM

## 2018-04-10 LAB — BASIC METABOLIC PANEL
Anion gap: 9 (ref 5–15)
BUN: 16 mg/dL (ref 6–20)
CO2: 29 mmol/L (ref 22–32)
Calcium: 9.3 mg/dL (ref 8.9–10.3)
Chloride: 100 mmol/L (ref 98–111)
Creatinine, Ser: 1.44 mg/dL — ABNORMAL HIGH (ref 0.61–1.24)
GFR calc Af Amer: >60 mL/min (ref >60)
GFR calc non Af Amer: 52 mL/min — ABNORMAL LOW (ref >60)
Glucose, Bld: 200 mg/dL — ABNORMAL HIGH (ref 70–99)
Potassium: 3.9 mmol/L (ref 3.5–5.1)
Sodium: 138 mmol/L (ref 135–145)

## 2018-04-10 LAB — BRAIN NATRIURETIC PEPTIDE: B Natriuretic Peptide: 18 pg/mL (ref 0.0–100.0)

## 2018-04-10 MED ORDER — LISINOPRIL 10 MG PO TABS: 10.0000 mg | ORAL_TABLET | Freq: Every day | ORAL | 3 refills | Status: DC

## 2018-04-10 NOTE — Patient Instructions (Signed)
Medication Instructions:  Your physician has recommended you make the following change in your medication:  Lisinopril 10 mg Daily    Labwork: Your physician recommends that you return for lab work in: Today  Your physician recommends that you return for lab work in: 2 weeks ( 04/24/18)     Testing/Procedures: Your physician has requested that you have an echocardiogram. Echocardiography is a painless test that uses sound waves to create images of your heart. It provides your doctor with information about the size and shape of your heart and how well your heart's chambers and valves are working. This procedure takes approximately one hour. There are no restrictions for this procedure.  Your physician has requested that you have en exercise stress myoview. For further information please visit [email or web address]. Please follow instruction sheet, as given.    Follow-Up: Your physician recommends that you schedule a follow-up appointment with Dr. Bronson Ing in the [place] office.    Any Other Special Instructions Will Be Listed Below (If Applicable).     If you need a refill on your cardiac medications before your next appointment, please call your pharmacy.  Thank you for choosing [facility]!   Low-Sodium Eating Plan Sodium, which is an element that makes up salt, helps you maintain a healthy balance of fluids in your body. Too much sodium can increase your blood pressure and cause fluid and waste to be held in your body. Your health care provider or dietitian may recommend following this plan if you have high blood pressure (hypertension), kidney disease, liver disease, or heart failure. Eating less sodium can help lower your blood pressure, reduce swelling, and protect your heart, liver, and kidneys. What are tips for following this plan? General guidelines  Most people on this plan should limit their sodium intake to 1,500-2,000 mg (milligrams) of sodium each  day. Reading food labels  The Nutrition Facts label lists the amount of sodium in one serving of the food. If you eat more than one serving, you must multiply the listed amount of sodium by the number of servings.  Choose foods with less than 140 mg of sodium per serving.  Avoid foods with 300 mg of sodium or more per serving. Shopping  Look for lower-sodium products, often labeled as "low-sodium" or "no salt added."  Always check the sodium content even if foods are labeled as "unsalted" or "no salt added".  Buy fresh foods. ? Avoid canned foods and premade or frozen meals. ? Avoid canned, cured, or processed meats  Buy breads that have less than 80 mg of sodium per slice. Cooking  Eat more home-cooked food and less restaurant, buffet, and fast food.  Avoid adding salt when cooking. Use salt-free seasonings or herbs instead of table salt or sea salt. Check with your health care provider or pharmacist before using salt substitutes.  Cook with plant-based oils, such as canola, sunflower, or olive oil. Meal planning  When eating at a restaurant, ask that your food be prepared with less salt or no salt, if possible.  Avoid foods that contain MSG (monosodium glutamate). MSG is sometimes added to [place] food, bouillon, and some canned foods. What foods are recommended? The items listed may not be a complete list. Talk with your dietitian about what dietary choices are best for you. Grains Low-sodium cereals, including oats, puffed wheat and rice, and shredded wheat. Low-sodium crackers. Unsalted rice. Unsalted pasta. Low-sodium bread. Whole-grain breads and whole-grain pasta. Vegetables Fresh or frozen vegetables. "No salt added"  canned vegetables. "No salt added" tomato sauce and paste. Low-sodium or reduced-sodium tomato and vegetable juice. Fruits Fresh, frozen, or canned fruit. Fruit juice. Meats and other protein foods Fresh or frozen (no salt added) meat, poultry, seafood,  and fish. Low-sodium canned tuna and salmon. Unsalted nuts. Dried peas, beans, and lentils without added salt. Unsalted canned beans. Eggs. Unsalted nut butters. Dairy Milk. Soy milk. Cheese that is naturally low in sodium, such as ricotta cheese, fresh mozzarella, or Swiss cheese Low-sodium or reduced-sodium cheese. Cream cheese. Yogurt. Fats and oils Unsalted butter. Unsalted margarine with no trans fat. Vegetable oils such as canola or olive oils. Seasonings and other foods Fresh and dried herbs and spices. Salt-free seasonings. Low-sodium mustard and ketchup. Sodium-free salad dressing. Sodium-free light mayonnaise. Fresh or refrigerated horseradish. Lemon juice. Vinegar. Homemade, reduced-sodium, or low-sodium soups. Unsalted popcorn and pretzels. Low-salt or salt-free chips. What foods are not recommended? The items listed may not be a complete list. Talk with your dietitian about what dietary choices are best for you. Grains Instant hot cereals. Bread stuffing, pancake, and biscuit mixes. Croutons. Seasoned rice or pasta mixes. Noodle soup cups. Boxed or frozen macaroni and cheese. Regular salted crackers. Self-rising flour. Vegetables Sauerkraut, pickled vegetables, and relishes. Olives. [place] fries. Onion rings. Regular canned vegetables (not low-sodium or reduced-sodium). Regular canned tomato sauce and paste (not low-sodium or reduced-sodium). Regular tomato and vegetable juice (not low-sodium or reduced-sodium). Frozen vegetables in sauces. Meats and other protein foods Meat or fish that is salted, canned, smoked, spiced, or pickled. Bacon, ham, sausage, hotdogs, corned beef, chipped beef, packaged lunch meats, salt pork, jerky, pickled herring, anchovies, regular canned tuna, sardines, salted nuts. Dairy Processed cheese and cheese spreads. Cheese curds. Blue cheese. Feta cheese. String cheese. Regular cottage cheese. Buttermilk. Canned milk. Fats and oils Salted butter. Regular  margarine. Ghee. Bacon fat. Seasonings and other foods Onion salt, garlic salt, seasoned salt, table salt, and sea salt. Canned and packaged gravies. Worcestershire sauce. Tartar sauce. Barbecue sauce. Teriyaki sauce. Soy sauce, including reduced-sodium. Steak sauce. Fish sauce. Oyster sauce. Cocktail sauce. Horseradish that you find on the shelf. Regular ketchup and mustard. Meat flavorings and tenderizers. Bouillon cubes. Hot sauce and Tabasco sauce. Premade or packaged marinades. Premade or packaged taco seasonings. Relishes. Regular salad dressings. Salsa. Potato and tortilla chips. Corn chips and puffs. Salted popcorn and pretzels. Canned or dried soups. Pizza. Frozen entrees and pot pies. Summary  Eating less sodium can help lower your blood pressure, reduce swelling, and protect your heart, liver, and kidneys.  Most people on this plan should limit their sodium intake to 1,500-2,000 mg (milligrams) of sodium each day.  Canned, boxed, and frozen foods are high in sodium. Restaurant foods, fast foods, and pizza are also very high in sodium. You also get sodium by adding salt to food.  Try to cook at home, eat more fresh fruits and vegetables, and eat less fast food, canned, processed, or prepared foods. This information is not intended to replace advice given to you by your health care provider. Make sure you discuss any questions you have with your health care provider. Document Released: 01/29/2002 Document Revised: 08/02/2016 Document Reviewed: 08/02/2016 Elsevier Interactive Patient Education  [employer].

## 2018-04-11 ENCOUNTER — Telehealth: Payer: Self-pay | Admitting: *Deleted

## 2018-04-11 ENCOUNTER — Encounter: Payer: Self-pay | Admitting: *Deleted

## 2018-04-11 MED ORDER — HYDRALAZINE HCL 25 MG PO TABS: 25.0000 mg | ORAL_TABLET | Freq: Two times a day (BID) | ORAL | 3 refills | Status: DC

## 2018-04-11 NOTE — Telephone Encounter (Signed)
Medication sent.

## 2018-04-11 NOTE — Telephone Encounter (Signed)
-----   Message from Imogene Burn, PA-C sent at 04/10/2018  1:59 PM EDT ----- Kidney function up so please contact patient ASAP and tell him not to start the lisinopril.  Begin hydralazine 25 mg twice a day for high blood pressure.  Repeat labs in 2 weeks.  Heart failure marker was normal.  May need to reduce amount of Lasix he is taking.  He had labs drawn by PCP so if we could get those to compare what his kidney function is that would be great.

## 2018-04-11 NOTE — Telephone Encounter (Signed)
Called patient with test results. No answer. Left message to call back.  

## 2018-04-18 ENCOUNTER — Encounter (HOSPITAL_COMMUNITY): Payer: BC Managed Care – PPO

## 2018-04-18 ENCOUNTER — Ambulatory Visit (HOSPITAL_COMMUNITY)
Admission: RE | Admit: 2018-04-18 | Discharge: 2018-04-18 | Disposition: A | Discharge status: Home / Self Care | Payer: BC Managed Care – PPO | Source: Ambulatory Visit | Attending: Physician Assistant | Admitting: Physician Assistant

## 2018-04-18 DIAGNOSIS — I358 Other nonrheumatic aortic valve disorders: Secondary | ICD-10-CM | POA: Insufficient documentation

## 2018-04-18 DIAGNOSIS — I5032 Chronic diastolic (congestive) heart failure: Secondary | ICD-10-CM | POA: Insufficient documentation

## 2018-04-18 DIAGNOSIS — I110 Hypertensive heart disease with heart failure: Secondary | ICD-10-CM | POA: Diagnosis not present

## 2018-04-18 DIAGNOSIS — K219 Gastro-esophageal reflux disease without esophagitis: Secondary | ICD-10-CM | POA: Insufficient documentation

## 2018-04-18 DIAGNOSIS — I209 Angina pectoris, unspecified: Secondary | ICD-10-CM | POA: Insufficient documentation

## 2018-04-18 NOTE — Progress Notes (Signed)
*  PRELIMINARY RESULTS* Echocardiogram 2D Echocardiogram has been performed.  Bryan Austin 04/18/2018, 10:28 AM

## 2018-04-24 ENCOUNTER — Other Ambulatory Visit (HOSPITAL_COMMUNITY)
Admission: RE | Admit: 2018-04-24 | Discharge: 2018-04-24 | Disposition: A | Discharge status: Home / Self Care | Payer: BC Managed Care – PPO | Source: Other Acute Inpatient Hospital | Attending: Physician Assistant | Admitting: Physician Assistant

## 2018-04-24 DIAGNOSIS — I5032 Chronic diastolic (congestive) heart failure: Secondary | ICD-10-CM | POA: Diagnosis not present

## 2018-04-24 DIAGNOSIS — I10 Essential (primary) hypertension: Secondary | ICD-10-CM | POA: Insufficient documentation

## 2018-04-24 LAB — BASIC METABOLIC PANEL
Anion gap: 6 (ref 5–15)
BUN: 17 mg/dL (ref 6–20)
CO2: 27 mmol/L (ref 22–32)
Calcium: 9.3 mg/dL (ref 8.9–10.3)
Chloride: 106 mmol/L (ref 98–111)
Creatinine, Ser: 1.13 mg/dL (ref 0.61–1.24)
GFR calc Af Amer: >60 mL/min (ref >60)
GFR calc non Af Amer: >60 mL/min (ref >60)
Glucose, Bld: 121 mg/dL — ABNORMAL HIGH (ref 70–99)
Potassium: 4.2 mmol/L (ref 3.5–5.1)
Sodium: 139 mmol/L (ref 135–145)

## 2018-05-08 ENCOUNTER — Encounter: Payer: Self-pay | Admitting: Cardiovascular Disease

## 2018-05-08 ENCOUNTER — Ambulatory Visit: Payer: BC Managed Care – PPO | Admitting: Cardiovascular Disease

## 2018-05-08 VITALS — BP 145/79 | HR 99 | Ht 71.0 in | Wt 285.8 lb

## 2018-05-08 DIAGNOSIS — I10 Essential (primary) hypertension: Secondary | ICD-10-CM

## 2018-05-08 DIAGNOSIS — R079 Chest pain, unspecified: Secondary | ICD-10-CM | POA: Diagnosis not present

## 2018-05-08 DIAGNOSIS — Q231 Congenital insufficiency of aortic valve: Secondary | ICD-10-CM | POA: Diagnosis not present

## 2018-05-08 DIAGNOSIS — I5032 Chronic diastolic (congestive) heart failure: Secondary | ICD-10-CM

## 2018-05-08 NOTE — Progress Notes (Signed)
SUBJECTIVE: The patient presents for follow-up of chest pain and bicuspid aortic valve.  He is morbidly obese.  He saw Gerrianne Scale PA-C on 04/10/2018.  It was suspected he had some diastolic heart failure secondary to untreated hypertension, obesity, and frequently eating at restaurants.  An echocardiogram was ordered as well as a basic metabolic panel and BNP.  He also describe chest pain and a stress test was ordered.  Lisinopril 10 mg daily was started for high blood pressure.  He was found to have renal dysfunction and lisinopril was discontinued.  Hydralazine 25 mg twice daily was started for hypertension.  BNP was normal.  BUN 16, creatinine 1.44 on 04/10/2018.  Renal function normalized on 04/24/2018, BUN 17, creatinine 1.13.  Echocardiogram 04/18/2018 demonstrated normal left ventricular systolic function, normal diastolic function, and normal regional wall motion, LVEF 55 to 60%, mild LVH, bicuspid aortic valve with no significant regurgitation and mild stenosis with a peak gradient of 15 mmHg.  He did not pursue the stress test because he did not feel he needed it.  He checks his blood pressure at home and it runs between 118-124/59-69.  He has begun walking his driveway which is about 350 feet and has a slight incline.  He has gone down to 277 pounds on his home scale and had been 304 pounds.  He appears to be quite motivated to lose weight.  He wants to get down to 200 or less.  The patient denies any symptoms of chest pain, palpitations, shortness of breath, lightheadedness, dizziness, leg swelling, orthopnea, PND, and syncope.       Review of Systems: As per "subjective", otherwise negative.  Allergies  Allergen Reactions  . Adhesive [Tape]     RASH, ITCHING    Current Outpatient Medications  Medication Sig Dispense Refill  . colchicine 0.6 MG tablet Take 0.6 mg by mouth daily as needed.    . furosemide (LASIX) 40 MG tablet Take 40 mg by mouth daily.    . Melatonin 5 MG  TABS Take 5 mg by mouth at bedtime.    . hydrALAZINE (APRESOLINE) 25 MG tablet Take 1 tablet (25 mg total) by mouth 2 (two) times daily. (Patient not taking: Reported on 05/08/2018) 180 tablet 3   No current facility-administered medications for this visit.     Past Medical History:  Diagnosis Date  . GERD without esophagitis 2016   NEG CARDIAC EVALUATION [facility]    Past Surgical History:  Procedure Laterality Date  . CHOLECYSTECTOMY  2006   GB FULL OF "SAND", NEEDED A DRAIN DUE TO INFECTION  . COLONOSCOPY  [facility]  . COLONOSCOPY N/A 10/14/2014   Procedure: COLONOSCOPY;  Surgeon: Danie Binder, MD;  Location[facility];  Service: Endoscopy;  Laterality: N/A;  100pm  . ESOPHAGOGASTRODUODENOSCOPY N/A 10/14/2014   Procedure: ESOPHAGOGASTRODUODENOSCOPY (EGD);  Surgeon: Danie Binder, MD;  Location: [facility];  Service: Endoscopy;  Laterality: N/A;  . HEMORRHOID BANDING N/A 10/14/2014   Procedure: HEMORRHOID BANDING;  Surgeon: Danie Binder, MD;  Location: [facility];  Service: Endoscopy;  Laterality: N/A;  . HERNIA REPAIR  [id number]   COMPLICATED BY PROLONGED ILEUS    Social History   Socioeconomic History  . Marital status: Married    Spouse name: Not on file  . Number of children: Not on file  . Years of education: Not on file  . Highest education level: Not on file  Occupational History  . Not on  file  Social Needs  . Financial resource strain: Not on file  . Food insecurity:    Worry: Not on file    Inability: Not on file  . Transportation needs:    Medical: Not on file    Non-medical: Not on file  Tobacco Use  . Smoking status: Never Smoker  . Smokeless tobacco: Never Used  Substance and Sexual Activity  . Alcohol use: Not on file  . Drug use: Not on file  . Sexual activity: Not on file  Lifestyle  . Physical activity:    Days per week: Not on file    Minutes per session: Not on file  . Stress: Not on file  Relationships  . Social connections:    Talks on  phone: Not on file    Gets together: Not on file    Attends religious service: Not on file    Active member of club or organization: Not on file    Attends meetings of clubs or organizations: Not on file    Relationship status: Not on file  . Intimate partner violence:    Fear of current or ex partner: Not on file    Emotionally abused: Not on file    Physically abused: Not on file    Forced sexual activity: Not on file  Other Topics Concern  . Not on file  Social History Narrative   Part owner OF [employer]     Vitals:   05/08/18 1347  BP: (!) 145/79  Pulse: 99  Weight: 285 lb 12.8 oz (129.6 kg)  Height: 5\' 11"  (1.803 m)    Wt Readings from Last 3 Encounters:  05/08/18 285 lb 12.8 oz (129.6 kg)  04/10/18 299 lb (135.6 kg)  08/09/17 284 lb (128.8 kg)     PHYSICAL EXAM General: NAD HEENT: Normal. Neck: No JVD, no thyromegaly. Lungs: Clear to auscultation bilaterally with normal respiratory effort. CV: Regular rate and rhythm, normal S1/S2, no K5/L9, 2/6 systolic murmur loudest over right upper sternal border. No pretibial or periankle edema.  No carotid bruit.   Abdomen: Soft, nontender, obese.  Neurologic: Alert and oriented.  Psych: Normal affect. Skin: Normal. Musculoskeletal: No gross deformities.    ECG: Reviewed above under Subjective   Labs: Lab Results  Component Value Date/Time   K 4.2 04/24/2018 11:41 AM   BUN 17 04/24/2018 11:41 AM   CREATININE 1.13 04/24/2018 11:41 AM     Lipids: No results found for: LDLCALC, LDLDIRECT, CHOL, TRIG, HDL     ASSESSMENT AND PLAN:  1. Chest pain:Symptomatically stable.   He did not obtain the nuclear stress test ordered in August 2019. No aortopathy by chest CT in June 2018.  2. Essential JTT:SVXBLT elevated in our office but routinely normal at home.  He is not taking hydralazine.   3.Morbid obesity:I congratulated him on his 27 pound weight loss.  He has begun exercising and plans to get down  to 200 pounds by the time of his next visit with me.  4. Bicuspid aortic valve:Very mild stenosis by echocardiogram in August 2019 as detailed above.  I will monitor. No aortopathy by chest CT in June 2018.  5.  Chronic diastolic heart failure: He takes Lasix 40 mg every other day.  He wants to lose weight to help control this as well.   Disposition: Follow up 1 year   Kate Sable, M.D., F.A.C.C.

## 2018-05-08 NOTE — Patient Instructions (Signed)

## 2019-06-08 ENCOUNTER — Emergency Department (HOSPITAL_COMMUNITY): Payer: BC Managed Care – PPO

## 2019-06-08 ENCOUNTER — Other Ambulatory Visit: Payer: Self-pay

## 2019-06-08 ENCOUNTER — Encounter (HOSPITAL_COMMUNITY): Payer: Self-pay

## 2019-06-08 ENCOUNTER — Emergency Department (HOSPITAL_COMMUNITY)
Admission: EM | Admit: 2019-06-08 | Discharge: 2019-06-08 | Disposition: A | Discharge status: Home / Self Care | Payer: BC Managed Care – PPO | Attending: Emergency Medicine | Admitting: Emergency Medicine

## 2019-06-08 DIAGNOSIS — S6992XA Unspecified injury of left wrist, hand and finger(s), initial encounter: Secondary | ICD-10-CM | POA: Diagnosis present

## 2019-06-08 DIAGNOSIS — S63283A Dislocation of proximal interphalangeal joint of left middle finger, initial encounter: Secondary | ICD-10-CM | POA: Diagnosis not present

## 2019-06-08 DIAGNOSIS — I110 Hypertensive heart disease with heart failure: Secondary | ICD-10-CM | POA: Insufficient documentation

## 2019-06-08 DIAGNOSIS — S62002A Unspecified fracture of navicular [scaphoid] bone of left wrist, initial encounter for closed fracture: Secondary | ICD-10-CM | POA: Diagnosis not present

## 2019-06-08 DIAGNOSIS — S63502A Unspecified sprain of left wrist, initial encounter: Secondary | ICD-10-CM | POA: Diagnosis not present

## 2019-06-08 DIAGNOSIS — Y92009 Unspecified place in unspecified non-institutional (private) residence as the place of occurrence of the external cause: Secondary | ICD-10-CM | POA: Diagnosis not present

## 2019-06-08 DIAGNOSIS — Z79899 Other long term (current) drug therapy: Secondary | ICD-10-CM | POA: Insufficient documentation

## 2019-06-08 DIAGNOSIS — I509 Heart failure, unspecified: Secondary | ICD-10-CM | POA: Diagnosis not present

## 2019-06-08 DIAGNOSIS — W19XXXA Unspecified fall, initial encounter: Secondary | ICD-10-CM | POA: Diagnosis not present

## 2019-06-08 DIAGNOSIS — Y939 Activity, unspecified: Secondary | ICD-10-CM | POA: Diagnosis not present

## 2019-06-08 DIAGNOSIS — Y999 Unspecified external cause status: Secondary | ICD-10-CM | POA: Diagnosis not present

## 2019-06-08 MED ORDER — MELOXICAM 7.5 MG PO TABS: 7.5000 mg | ORAL_TABLET | Freq: Every day | ORAL | 0 refills | Status: DC

## 2019-06-08 MED ORDER — TRAMADOL HCL 50 MG PO TABS: 100.0000 mg | ORAL_TABLET | Freq: Four times a day (QID) | ORAL | 0 refills | Status: DC | PRN

## 2019-06-08 NOTE — ED Triage Notes (Signed)
Pt fell at home approx 5 hours ago, states he went to [place] and waited 4 hours before coming here.   Pain and swelling to left wrist.

## 2019-06-08 NOTE — ED Provider Notes (Signed)
[facility] EMERGENCY DEPARTMENT Provider Note   CSN: [id number] Arrival date & time: 06/08/19  0054   Time seen 2:10 AM  History   Chief Complaint Chief Complaint  Patient presents with  . Wrist Injury    HPI Bryan Austin is a 58 y.o. male.     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  He points to the PIP joint.  He states that he pulled it back in place.  He also complains of pain in his left wrist.  Patient is right-handed.  Patient states he has an appointment with a neurosurgeon on Tuesday, October 20 for some neck issues that he has been having.  PCP [place], [name], MD   Past Medical History:  Diagnosis Date  . GERD without esophagitis 2016   NEG CARDIAC EVALUATION [facility]    Patient Active Problem List   Diagnosis Date Noted  . Diastolic CHF, chronic ([place]) 04/10/2018  . Chest pain 04/10/2018  . Morbid obesity ([place]) 04/10/2018  . Essential hypertension 04/05/2018  . Aortic valve disorder 04/05/2018  . Rectal bleeding 09/25/2014  . GERD without esophagitis 09/25/2014    Past Surgical History:  Procedure Laterality Date  . CHOLECYSTECTOMY  2006   GB FULL OF "SAND", NEEDED A DRAIN DUE TO INFECTION  . COLONOSCOPY  [facility]  . COLONOSCOPY N/A 10/14/2014   Procedure: COLONOSCOPY;  Surgeon: Danie Binder, MD;  [facility];  Service: Endoscopy;  Laterality: N/A;  100pm  . ESOPHAGOGASTRODUODENOSCOPY N/A 10/14/2014   Procedure: ESOPHAGOGASTRODUODENOSCOPY (EGD);  Surgeon: Danie Binder, MD;  [facility];  Service: Endoscopy;  Laterality: N/A;  . HEMORRHOID BANDING N/A 10/14/2014   Procedure: HEMORRHOID BANDING;  Surgeon: Danie Binder, MD;  [facility];  Service: Endoscopy;  Laterality: N/A;  . HERNIA REPAIR  [id number]   COMPLICATED BY PROLONGED ILEUS        Home Medications    Prior to Admission medications   Medication Sig Start Date End Date Taking? Authorizing Provider  colchicine 0.6 MG tablet Take 0.6 mg by mouth daily as needed.   Yes [provider]  furosemide (LASIX) 40 MG tablet Take 40 mg by mouth daily.   Yes [provider]  gabapentin (NEURONTIN) 300 MG capsule Take 300 mg by mouth 3 (three) times daily.   Yes [provider]  Melatonin 5 MG TABS Take 5 mg by mouth at bedtime.   Yes [provider]  meloxicam (MOBIC) 7.5 MG tablet Take 1 tablet (7.5 mg total) by mouth daily. 06/08/19   Rolland Porter, MD  traMADol (ULTRAM) 50 MG tablet Take 2 tablets (100 mg total) by mouth every 6 (six) hours as needed. 06/08/19   Rolland Porter, MD    Family History Family History  Problem Relation Age of Onset  . Colon polyps Mother   . Kidney disease Mother   . Colon cancer Maternal Grandmother     Social History Social History   Tobacco Use  . Smoking status: Never Smoker  . Smokeless tobacco: Never Used  Substance Use Topics  . Alcohol use: Never    Alcohol/week: 0.0 standard drinks    Frequency: Never  . Drug use: Never  employed at [employer] in [place]   Review of Systems Review of Systems  All other systems reviewed and are negative.    Physical Exam Updated Vital Signs BP (!) 157/93 (BP Location: Right Arm)   Pulse 86   Temp 98.2 F (36.8 C) (Oral)   Resp 15   Ht 5\' 11"  (1.803 m)   Wt 133.8 kg   SpO2 100%   BMI 41.14 kg/m   Physical Exam Vitals signs and nursing note reviewed.  Constitutional:      Appearance: Normal appearance. He is obese.  HENT:     Head: Normocephalic and atraumatic.     Right Ear: External ear normal.     Left Ear: External ear normal.  Eyes:     Extraocular Movements: Extraocular movements intact.     Conjunctiva/sclera: Conjunctivae normal.  Neck:     Musculoskeletal:  Normal range of motion.  Cardiovascular:     Rate and Rhythm: Normal rate.  Pulmonary:     Effort: Pulmonary effort is normal. No respiratory distress.  Musculoskeletal:        General: Swelling and tenderness present.     Comments: Patient is noted to have some diffuse swelling of his left middle finger and he is very tender over the PIP joint.  He also has some discomfort in the snuffbox and some pain on the radial aspect of his left wrist.  He has good distal pulses and capillary refill.    Skin:    General: Skin is warm and dry.     Capillary Refill: Capillary refill takes less than 2 seconds.  Neurological:     General: No focal deficit present.     Mental Status: He is alert and oriented to person, place, and time.     Cranial Nerves: No cranial nerve deficit.  Psychiatric:        Mood and Affect: Mood normal.        Behavior: Behavior normal.        Thought Content: Thought content normal.      ED Treatments / Results  Labs (all labs ordered are listed, but only abnormal results are displayed) Labs Reviewed - No data to display  EKG None  Radiology Dg Wrist Complete Left  Result Date: 06/08/2019 CLINICAL DATA:  Fall, pain and swelling remote fracture of the fifth metacarpal EXAM: LEFT WRIST - COMPLETE 3+ VIEW COMPARISON:  None. FINDINGS: Band of sclerosis in the distal fifth metacarpal diaphysis compatible with reported history of remote injury. Subtle lucency through the waist of the scaphoid on navicular view only. No other acute fracture or traumatic malalignment is seen. Carpal arcs are maintained. There is mild subchondral sclerosis of the radial articular surface. Minimal spurring of the first carpometacarpal joint as well. No other significant arthrosis or suspicious osseous lesions. IMPRESSION: 1. Question a subtle lucency through the scaphoid waist. Could reflect a nondisplaced fracture. Correlate for tenderness in the anatomic snuffbox. 2. Minimal radiocarpal and  first carpometacarpal arthrosis. 3. Remote posttraumatic change of the fifth metacarpal. Electronically Signed   By: Lovena Le M.D.   On: 06/08/2019 01:28   Dg Hand Complete Left  Result Date: 06/08/2019 CLINICAL DATA:  [place], jammed fingers, dislocation of the third digit at the PIP EXAM: LEFT HAND - COMPLETE 3+ VIEW COMPARISON:  Wrist radiographs 06/08/2019 FINDINGS: Tiny ossific fragment adjacent the base of the third middle phalanx may reflect a volar plate avulsion given history of recent dislocation at this  joint. There is circumferential swelling of the third digit. No other acute fracture or traumatic malalignment is seen. Small accessory ossicle is noted along the dorsal aspect of the wrist. IMPRESSION: Tiny ossific fragment adjacent the base of the third middle phalanx may reflect a volar plate avulsion given history of recent dislocation at this joint. Electronically Signed   By: Lovena Le M.D.   On: 06/08/2019 03:03    Procedures Procedures (including critical care time)  Medications Ordered in ED Medications - No data to display   Initial Impression / Assessment and Plan / ED Course  I have reviewed the triage vital signs and the nursing notes.  Pertinent labs & imaging results that were available during my care of the patient were reviewed by me and considered in my medical decision making (see chart for details).       Patient had had his wrist x-ray when I saw him and he was sent back to get an x-ray of his hand, especially because of the finger dislocation.   Patient was placed in a finger splint and a Velcro thumb spica.  He does not have an orthopedist to follow-up with.  He was referred to Dr. Aline Brochure.  Final Clinical Impressions(s) / ED Diagnoses   Final diagnoses:  Dislocation of proximal interphalangeal joint of left middle finger, initial encounter  Sprain of left wrist, initial encounter  Closed nondisplaced fracture of scaphoid of left wrist,  unspecified portion of scaphoid, initial encounter    ED Discharge Orders         Ordered    traMADol (ULTRAM) 50 MG tablet  Every 6 hours PRN     06/08/19 0318    meloxicam (MOBIC) 7.5 MG tablet  Daily     06/08/19 0319         Plan discharge  Rolland Porter, MD, Barbette Or, MD 06/08/19 [phone]

## 2019-06-08 NOTE — Discharge Instructions (Addendum)
Elevate your hand, use ice packs to keep the swelling down and for comfort.  Take your meloxicam for pain.  Use the tramadol for pain that keeps you awake. Wear the finger splints and thumb spica splint until you are rechecked by Dr Aline Brochure, the orthopedist on call.

## 2019-06-12 DIAGNOSIS — S63283A Dislocation of proximal interphalangeal joint of left middle finger, initial encounter: Secondary | ICD-10-CM | POA: Insufficient documentation

## 2019-08-31 ENCOUNTER — Telehealth: Payer: Self-pay | Admitting: Cardiovascular Disease

## 2019-08-31 NOTE — Telephone Encounter (Signed)

## 2019-09-05 ENCOUNTER — Encounter: Payer: Self-pay | Admitting: Cardiovascular Disease

## 2019-09-05 ENCOUNTER — Telehealth (INDEPENDENT_AMBULATORY_CARE_PROVIDER_SITE_OTHER): Payer: BC Managed Care – PPO | Admitting: Cardiovascular Disease

## 2019-09-05 ENCOUNTER — Telehealth: Payer: Self-pay | Admitting: Cardiovascular Disease

## 2019-09-05 VITALS — BP 123/73 | Ht 71.0 in | Wt 285.0 lb

## 2019-09-05 DIAGNOSIS — Q231 Congenital insufficiency of aortic valve: Secondary | ICD-10-CM

## 2019-09-05 DIAGNOSIS — I10 Essential (primary) hypertension: Secondary | ICD-10-CM

## 2019-09-05 DIAGNOSIS — I5032 Chronic diastolic (congestive) heart failure: Secondary | ICD-10-CM

## 2019-09-05 DIAGNOSIS — R079 Chest pain, unspecified: Secondary | ICD-10-CM

## 2019-09-05 NOTE — Telephone Encounter (Signed)
  Precert needed for:  Echo   Location: [facility]    Date: Apr 04, 2020

## 2019-09-05 NOTE — Patient Instructions (Addendum)
Medication Instructions:   Your physician recommends that you continue on your current medications as directed. Please refer to the Current Medication list given to you today.  Labwork:  NONE  Testing/Procedures: Your physician has requested that you have an echocardiogram in August 2021. Echocardiography is a painless test that uses sound waves to create images of your heart. It provides your doctor with information about the size and shape of your heart and how well your heart's chambers and valves are working. This procedure takes approximately one hour. There are no restrictions for this procedure.  Follow-Up:  Your physician recommends that you schedule a follow-up appointment in: 1 year (virtual). You will receive a reminder letter in the mail in about 10 months reminding you to call and schedule your appointment. If you don't receive this letter, please contact our office.  Any Other Special Instructions Will Be Listed Below (If Applicable).  If you need a refill on your cardiac medications before your next appointment, please call your pharmacy.

## 2019-09-05 NOTE — Progress Notes (Signed)
Virtual Visit via Telephone Note   This visit type was conducted due to national recommendations for restrictions regarding the COVID-19 Pandemic (e.g. social distancing) in an effort to limit this patient's exposure and mitigate transmission in our community.  Due to his co-morbid illnesses, this patient is at least at moderate risk for complications without adequate follow up.  This format is felt to be most appropriate for this patient at this time.  The patient did not have access to video technology/had technical difficulties with video requiring transitioning to audio format only (telephone).  All issues noted in this document were discussed and addressed.  No physical exam could be performed with this format.  Please refer to the patient's chart for his  consent to telehealth for [facility].   Date:  09/05/2019   ID:  Bryan Austin, DOB Jul 10, 1961, MRN [id number]  Patient Location: Home Provider Location: Office  PCP:  Ranae Palms, MD  Cardiologist:  Kate Sable, MD  Electrophysiologist:  None   Evaluation Performed:  Follow-Up Visit  Chief Complaint:  chest pain and bicuspid aortic valve  History of Present Illness:    Bryan Austin is a 59 y.o. male with a history of chest pain, bicuspid aortic valve, and morbid obesity.  He has been feeling very well and denies chest pain and palpitations.  He gets some leg swelling and uses Lasix about once every 2 weeks.  He plans to get the COVID-19 vaccine in the near future.    Past Medical History:  Diagnosis Date  . GERD without esophagitis 2016   NEG CARDIAC EVALUATION [facility]   Past Surgical History:  Procedure Laterality Date  . CHOLECYSTECTOMY  2006   GB FULL OF "SAND", NEEDED A DRAIN DUE TO INFECTION  . COLONOSCOPY  [facility]  . COLONOSCOPY N/A 10/14/2014   Procedure: COLONOSCOPY;  Surgeon: Danie Binder, MD;  [facility];  Service: Endoscopy;  Laterality: N/A;  100pm  . ESOPHAGOGASTRODUODENOSCOPY  N/A 10/14/2014   Procedure: ESOPHAGOGASTRODUODENOSCOPY (EGD);  Surgeon: Danie Binder, MD;  [facility];  Service: Endoscopy;  Laterality: N/A;  . HEMORRHOID BANDING N/A 10/14/2014   Procedure: HEMORRHOID BANDING;  Surgeon: Danie Binder, MD;  [facility];  Service: Endoscopy;  Laterality: N/A;  . HERNIA REPAIR  [id number]   COMPLICATED BY PROLONGED ILEUS     Current Meds  Medication Sig  . colchicine 0.6 MG tablet Take 0.6 mg by mouth daily as needed.  . furosemide (LASIX) 40 MG tablet Take 40 mg by mouth daily as needed.   . Melatonin 5 MG TABS Take 5 mg by mouth at bedtime.     Allergies:   Adhesive [tape]   Social History   Tobacco Use  . Smoking status: Never Smoker  . Smokeless tobacco: Never Used  Substance Use Topics  . Alcohol use: Never    Alcohol/week: 0.0 standard drinks  . Drug use: Never     Family Hx: The patient's family history includes Colon cancer in his maternal grandmother; Colon polyps in his mother; Kidney disease in his mother.  ROS:   Please see the history of present illness.     All other systems reviewed and are negative.   Prior CV studies:   The following studies were reviewed today:  Echocardiogram 04/18/2018 demonstrated normal left ventricular systolic function, normal diastolic function, and normal regional wall motion, LVEF 55 to 60%, mild LVH, bicuspid aortic valve with no significant regurgitation and  mild stenosis with a peak gradient of 15 mmHg.  Labs/Other Tests and Data Reviewed:    EKG:  No ECG reviewed.  Recent Labs: No results found for requested labs within last 8760 hours.   Recent Lipid Panel No results found for: CHOL, TRIG, HDL, CHOLHDL, LDLCALC, LDLDIRECT  Wt Readings from Last 3 Encounters:  09/05/19 285 lb (129.3 kg)  06/08/19 295 lb (133.8 kg)  05/08/18 285 lb 12.8 oz (129.6 kg)     Objective:    Vital Signs:  BP 123/73   Ht 5\' 11"  (1.803 m)   Wt 285 lb (129.3 kg)   BMI 39.75 kg/m     VITAL SIGNS:  reviewed  ASSESSMENT & PLAN:    1. Chest pain:No symptom recurrence.  He did not obtain the nuclear stress test ordered in August 2019.Noaortopathyby chest CT in June 2018.  2. Essential HTN: Blood pressure is normal.  He is not on antihypertensive therapy.  3.Morbid obesity: He has lost 10 pounds since October 2020.  4. Bicuspid aortic valve:Very mild stenosis by echocardiogram in August 2019 as detailed above.  I will obtain a follow-up echocardiogram this summer. Noaortopathyby chest CT in June 2018.  5. Chronic diastolic heart failure: He takes Lasix 40 mg every two weeks.    COVID-19 Education: The signs and symptoms of COVID-19 were discussed with the patient and how to seek care for testing (follow up with PCP or arrange E-visit).  The importance of social distancing was discussed today.  Time:   Today, I have spent 10 minutes with the patient with telehealth technology discussing the above problems.     Medication Adjustments/Labs and Tests Ordered: Current medicines are reviewed at length with the patient today.  Concerns regarding medicines are outlined above.   Tests Ordered: No orders of the defined types were placed in this encounter.   Medication Changes: No orders of the defined types were placed in this encounter.   Follow Up:  Virtual Visit  in 1 year(s)  Signed, Kate Sable, MD  09/05/2019 2:27 PM    [facility]

## 2019-09-05 NOTE — Addendum Note (Signed)
Addended by: Merlene Laughter on: 09/05/2019 03:54 PM   Modules accepted: Orders

## 2019-09-25 ENCOUNTER — Encounter: Payer: Self-pay | Admitting: Gastroenterology

## 2019-12-21 ENCOUNTER — Encounter: Payer: Self-pay | Admitting: Physician Assistant

## 2019-12-21 ENCOUNTER — Other Ambulatory Visit: Payer: Self-pay

## 2019-12-21 ENCOUNTER — Ambulatory Visit: Payer: BC Managed Care – PPO | Admitting: Physician Assistant

## 2019-12-21 DIAGNOSIS — L409 Psoriasis, unspecified: Secondary | ICD-10-CM

## 2019-12-21 DIAGNOSIS — L918 Other hypertrophic disorders of the skin: Secondary | ICD-10-CM

## 2019-12-21 DIAGNOSIS — Z1283 Encounter for screening for malignant neoplasm of skin: Secondary | ICD-10-CM | POA: Diagnosis not present

## 2019-12-21 DIAGNOSIS — L820 Inflamed seborrheic keratosis: Secondary | ICD-10-CM | POA: Diagnosis not present

## 2019-12-21 MED ORDER — TRIAMCINOLONE ACETONIDE 0.1 % EX CREA: 1.0000 "application " | TOPICAL_CREAM | Freq: Two times a day (BID) | CUTANEOUS | 2 refills | Status: DC | PRN

## 2019-12-21 NOTE — Patient Instructions (Addendum)

## 2019-12-21 NOTE — Progress Notes (Signed)
   Follow-Up Visit   Subjective  Bryan Austin is a 59 y.o. male who presents for the following: Annual Exam (Multiple places that he is concern about. Places that have been present longer then 3 months. Across his forehead he has places that seem non healing. Wife has places circled across back.).    The following portions of the chart were reviewed this encounter and updated as appropriate: Tobacco  Allergies  Meds  Problems  Med Hx  Surg Hx  Fam Hx      Objective  Well appearing patient in no apparent distress; mood and affect are within normal limits.  A full examination was performed including scalp, head, eyes, ears, nose, lips, neck, chest, axillae, abdomen, back, buttocks, bilateral upper extremities, bilateral lower extremities, hands, feet, fingers, toes, fingernails, and toenails. All findings within normal limits unless otherwise noted below.  Objective  Left Forehead, Left Lower Back, Left Preauricular Area, Left Temple (2), Left Upper Back, Mid Back (8), Mid Forehead (2), Mid Frontal Scalp, Neck - Posterior, Right Forehead (2), Right Lower Back (3), Right Temple (2), Right Upper Back: Erythematous stuck-on, waxy papule or plaque.   Objective  Left 3rd Finger Proximal Interphalangeal Joint, Left 4th Finger Proximal Interphalangeal Joint, Left Elbow - Posterior, Left Proximal 2nd Finger, Left Proximal 5th Finger, Right 2nd Finger Proximal Interphalangeal Joint, Right 3rd Finger Proximal Interphalangeal Joint, Right 4th Finger Proximal Interphalangeal Joint, Right Elbow - Posterior: Low grade psoriasis.  Objective  Mid Occipital Scalp: No DN No signs of NMSC  Objective  Left Axilla (41), Left Thigh - Anterior (5), Neck - Anterior (10), Right Anterior Neck, Right Thigh - Anterior: Fleshy, skin-colored sessile and pedunculated papules.    Assessment & Plan  Inflamed seborrheic keratosis (26) Neck - Posterior; Left Upper Back; Right Upper Back; Left Lower Back;  Right Lower Back (3); Mid Back (8); Mid Frontal Scalp; Mid Forehead (2); Left Temple (2); Right Temple (2); Left Preauricular Area; Left Forehead; Right Forehead (2)  Destruction of lesion - Left Lower Back, Left Upper Back, Mid Back, Neck - Posterior, Right Lower Back, Right Upper Back Complexity: simple   Destruction method: cryotherapy   Informed consent: discussed and consent obtained   Timeout:  patient name, date of birth, surgical site, and procedure verified Lesion destroyed using liquid nitrogen: Yes   Cryotherapy cycles:  1 Outcome: patient tolerated procedure well with no complications   Post-procedure details: wound care instructions given    Psoriasis (9) Left Elbow - Posterior; Right Elbow - Posterior; Left Proximal 2nd Finger; Left Proximal 5th Finger; Right 2nd Finger Proximal Interphalangeal Joint; Left 3rd Finger Proximal Interphalangeal Joint; Right 3rd Finger Proximal Interphalangeal Joint; Left 4th Finger Proximal Interphalangeal Joint; Right 4th Finger Proximal Interphalangeal Joint  Also, told him to purchase Amlactin (OTC) to use daily.   Ordered Medications: triamcinolone cream (KENALOG) 0.1 %  Screening exam for skin cancer UBSE No atypical nevi noted at the time of the visit. observe  Skin tag (58) Neck - Anterior (10); Left Thigh - Anterior (5); Right Thigh - Anterior; Left Axilla (41); Right Anterior Neck  60 minute surgery appointment  I, Shadonna Benedick, PA-C, have reviewed all documentation for this visit. The documentation on 12/21/19 for the exam, diagnosis, procedures, and orders are all accurate and complete.

## 2020-03-14 ENCOUNTER — Encounter: Payer: BC Managed Care – PPO | Admitting: Physician Assistant

## 2020-04-04 ENCOUNTER — Ambulatory Visit (HOSPITAL_COMMUNITY): Payer: BC Managed Care – PPO

## 2020-06-09 NOTE — Progress Notes (Signed)
Referring-Chan Park MD Reason for referral-bicuspid aortic valve  HPI: 59 year old male for evaluation of bicuspid aortic valve at request of Renita Papa MD. CTA June 2018 showed no thoracic aortic aneurysm. Most recent echocardiogram August 2019 showed normal LV function, bicuspid aortic valve with mild aortic stenosis (peak velocity 15 mmHg). Since last seen he has dyspnea with more vigorous activities but not routine activities.  No orthopnea or PND.  Occasional pedal edema controlled with Lasix as needed.  No chest pain or syncope.  Current Outpatient Medications  Medication Sig Dispense Refill  . colchicine 0.6 MG tablet Take 0.6 mg by mouth daily as needed.    . cyanocobalamin 2000 MCG tablet Take 3,000 mcg by mouth daily.    . empagliflozin (JARDIANCE) 10 MG TABS tablet Take 10 mg by mouth daily.    . furosemide (LASIX) 40 MG tablet Take 40 mg by mouth daily as needed.     . Melatonin 5 MG TABS Take 5 mg by mouth at bedtime.    . metFORMIN (GLUCOPHAGE-XR) 500 MG 24 hr tablet Take 500 mg by mouth daily. Pt takes in the mornings.     No current facility-administered medications for this visit.    Allergies  Allergen Reactions  . Adhesive [Tape]     RASH, ITCHING     Past Medical History:  Diagnosis Date  . Arthritis    Neck C3,C4, C5  . GERD without esophagitis 2016   NEG CARDIAC EVALUATION [facility]    Past Surgical History:  Procedure Laterality Date  . CHOLECYSTECTOMY  2006   GB FULL OF "SAND", NEEDED A DRAIN DUE TO INFECTION  . COLONOSCOPY  [facility]  . COLONOSCOPY N/A 10/14/2014   Procedure: COLONOSCOPY;  Surgeon: Danie Binder, MD;  [facility];  Service: Endoscopy;  Laterality: N/A;  100pm  . ESOPHAGOGASTRODUODENOSCOPY N/A 10/14/2014   Procedure: ESOPHAGOGASTRODUODENOSCOPY (EGD);  Surgeon: Danie Binder, MD;  [facility];  Service: Endoscopy;  Laterality: N/A;  . HEMORRHOID BANDING N/A 10/14/2014   Procedure: HEMORRHOID BANDING;  Surgeon: Danie Binder, MD;  [facility];  Service: Endoscopy;  Laterality: N/A;  . HERNIA REPAIR  [id number]   COMPLICATED BY PROLONGED ILEUS    Social History   Socioeconomic History  . Marital status: Married    Spouse name: Not on file  . Number of children: Not on file  . Years of education: Not on file  . Highest education level: Not on file  Occupational History  . Not on file  Tobacco Use  . Smoking status: Never Smoker  . Smokeless tobacco: Never Used  Vaping Use  . Vaping Use: Never used  Substance and Sexual Activity  . Alcohol use: Never    Alcohol/week: 0.0 standard drinks  . Drug use: Never  . Sexual activity: Not on file  Other Topics Concern  . Not on file  Social History Narrative   Part owner OF [employer]   Social Determinants of Health   Financial Resource Strain:   . Difficulty of Paying Living Expenses: Not on file  Food Insecurity:   . Worried About Charity fundraiser in the Last Year: Not on file  . Ran Out of Food in the Last Year: Not on file  Transportation Needs:   . Lack of Transportation (Medical): Not on file  . Lack of Transportation (Non-Medical): Not on file  Physical Activity:   . Days of Exercise per Week: Not on file  .  Minutes of Exercise per Session: Not on file  Stress:   . Feeling of Stress : Not on file  Social Connections:   . Frequency of Communication with Friends and Family: Not on file  . Frequency of Social Gatherings with Friends and Family: Not on file  . Attends Religious Services: Not on file  . Active Member of Clubs or Organizations: Not on file  . Attends Archivist Meetings: Not on file  . Marital Status: Not on file  Intimate Partner Violence:   . Fear of Current or Ex-Partner: Not on file  . Emotionally Abused: Not on file  . Physically Abused: Not on file  . Sexually Abused: Not on file    Family History  Problem Relation Age of Onset  . Colon polyps Mother   . Kidney disease Mother   .  Colon cancer Maternal Grandmother     ROS: no fevers or chills, productive cough, hemoptysis, dysphasia, odynophagia, melena, hematochezia, dysuria, hematuria, rash, seizure activity, orthopnea, PND, pedal edema, claudication. Remaining systems are negative.  Physical Exam:   Blood pressure 136/78, pulse 71, height 5\' 11"  (1.803 m), weight 286 lb (129.7 kg), SpO2 94 %.  General:  Well developed/obese in NAD Skin warm/dry Patient not depressed No peripheral clubbing Back-normal HEENT-normal/normal eyelids Neck supple/normal carotid upstroke bilaterally; no bruits; no JVD; no thyromegaly chest - CTA/ normal expansion CV - RRR/normal S1 and S2; no rubs or gallops;  PMI nondisplaced, 2/6 systolic murmur left sternal border.  S2 is not diminished. Abdomen -NT/ND, no HSM, no mass, + bowel sounds, no bruit 2+ femoral pulses, no bruits Ext-no edema, chords, 2+ DP Neuro-grossly nonfocal  ECG -sinus rhythm at a rate of 71, right axis deviation, nonspecific ST changes.  Personally reviewed  A/P  1 bicuspid aortic valve-patient is asymptomatic.  We will plan to repeat echocardiogram to reassess aortic stenosis/aortic insufficiency.  Previous CTA showed no thoracic aortic aneurysm.  2 morbid obesity-we discussed the importance of diet, exercise and weight loss.  3 snoring-patient has a history of obstructive sleep apnea but had difficulties tolerating CPAP.  We will refer to pulmonary for further options.  4 mildly elevated blood pressure-I have asked him to track this and we will add an ARB if blood pressure is not at goal.  He will also bring his most recent lipids to [place] for review.  Given recently diagnosed diabetes mellitus he will likely need a statin.  Kirk Ruths, MD

## 2020-06-12 ENCOUNTER — Other Ambulatory Visit: Payer: Self-pay

## 2020-06-12 ENCOUNTER — Encounter: Payer: Self-pay | Admitting: Cardiology

## 2020-06-12 ENCOUNTER — Ambulatory Visit: Payer: BC Managed Care – PPO | Admitting: Cardiology

## 2020-06-12 VITALS — BP 136/78 | HR 71 | Ht 71.0 in | Wt 286.0 lb

## 2020-06-12 DIAGNOSIS — Q231 Congenital insufficiency of aortic valve: Secondary | ICD-10-CM | POA: Diagnosis not present

## 2020-06-12 DIAGNOSIS — R0683 Snoring: Secondary | ICD-10-CM

## 2020-06-12 DIAGNOSIS — I350 Nonrheumatic aortic (valve) stenosis: Secondary | ICD-10-CM | POA: Diagnosis not present

## 2020-06-12 NOTE — Patient Instructions (Signed)
  Testing/Procedures:  Your physician has requested that you have an echocardiogram. Echocardiography is a painless test that uses sound waves to create images of your heart. It provides your doctor with information about the size and shape of your heart and how well your heart's chambers and valves are working. This procedure takes approximately one hour. There are no restrictions for this procedure.[place]: At [facility], you and your health needs are our priority.  As part of our continuing mission to provide you with exceptional heart care, we have created designated Provider Care Teams.  These Care Teams include your primary Cardiologist (physician) and Advanced Practice Providers (APPs -  Physician Assistants and Nurse Practitioners) who all work together to provide you with the care you need, when you need it.  We recommend signing up for the patient portal called "MyChart".  Sign up information is provided on this After Visit Summary.  MyChart is used to connect with patients for Virtual Visits (Telemedicine).  Patients are able to view lab/test results, encounter notes, upcoming appointments, etc.  Non-urgent messages can be sent to your provider as well.   To learn more about what you can do with MyChart, go to [email or web address].    Your next appointment:   12 month(s)  The format for your next appointment:   In Person  Provider:   Kirk Ruths, MD

## 2020-06-20 ENCOUNTER — Telehealth: Payer: Self-pay | Admitting: Cardiology

## 2020-06-20 NOTE — Telephone Encounter (Signed)
Spoke with patient regarding Pulmonary consult appointment scheduled Tuesday 07/22/20 at 3:00 pm with D.r Nilda Calamity at [facility] [address]---phone is [phone].  Informed patient the information is in My Chart and I will also mail appointment information to him.  He voiced his understanding.

## 2020-07-14 ENCOUNTER — Other Ambulatory Visit: Payer: Self-pay

## 2020-07-14 ENCOUNTER — Ambulatory Visit (HOSPITAL_COMMUNITY)
Admission: RE | Admit: 2020-07-14 | Discharge: 2020-07-14 | Disposition: A | Discharge status: Home / Self Care | Payer: BC Managed Care – PPO | Source: Ambulatory Visit | Attending: Cardiology | Admitting: Cardiology

## 2020-07-14 DIAGNOSIS — Q231 Congenital insufficiency of aortic valve: Secondary | ICD-10-CM | POA: Diagnosis not present

## 2020-07-14 LAB — ECHOCARDIOGRAM COMPLETE
AR max vel: 1.36 cm2
AV Area VTI: 1.41 cm2
AV Area mean vel: 1.34 cm2
AV Mean grad: 10 mmHg
AV Peak grad: 19.9 mmHg
Ao pk vel: 2.23 m/s
Area-P 1/2: 3.12 cm2
S' Lateral: 2.9 cm

## 2020-07-14 NOTE — Progress Notes (Signed)
*  PRELIMINARY RESULTS* Echocardiogram 2D Echocardiogram has been performed.  Bryan Austin 07/14/2020, 9:30 AM

## 2020-07-22 ENCOUNTER — Other Ambulatory Visit: Payer: Self-pay

## 2020-07-22 ENCOUNTER — Ambulatory Visit: Payer: BC Managed Care – PPO | Admitting: Pulmonary Disease

## 2020-07-22 ENCOUNTER — Encounter: Payer: Self-pay | Admitting: Pulmonary Disease

## 2020-07-22 VITALS — BP 126/70 | HR 84 | Temp 98.0°F | Ht 71.0 in | Wt 289.6 lb

## 2020-07-22 DIAGNOSIS — G4733 Obstructive sleep apnea (adult) (pediatric): Secondary | ICD-10-CM

## 2020-07-22 NOTE — Progress Notes (Signed)
Bryan Austin    [id number]    Jul 01, 1961  Primary Care Physician:Park, [name], MD  Referring Physician: Lelon Perla, [address] [place],  [place] [address]  Chief complaint:  Patient being seen for history of obstructive sleep apnea  HPI:  Patient was diagnosed with obstructive sleep apnea about 3 to 4 years ago -Studies not available to be reviewed -He stated that he had severe obstructive sleep apnea  He did try CPAP but was not able to tolerate it, had issues with multiple masks  Significantly in the past few years weight has gone up significantly the past few years, thyroid function tests have been negative  Sleep is nonrestorative Dry mouth No headaches   Outpatient Encounter Medications as of 07/22/2020  Medication Sig  . albuterol (VENTOLIN HFA) 108 (90 Base) MCG/ACT inhaler INHALE ONE TO TWO PUFFS EVERY 4 TO 6 HOURS AS NEEDED FOR COUGH/WHEEZE  . colchicine 0.6 MG tablet Take 0.6 mg by mouth daily as needed.  . cyanocobalamin 2000 MCG tablet Take 3,000 mcg by mouth daily.  . empagliflozin (JARDIANCE) 10 MG TABS tablet Take 10 mg by mouth daily.  . furosemide (LASIX) 40 MG tablet Take 40 mg by mouth daily as needed.   . Melatonin 5 MG TABS Take 5 mg by mouth at bedtime.  . metFORMIN (GLUCOPHAGE-XR) 500 MG 24 hr tablet Take 500 mg by mouth daily. Pt takes in the mornings.   No facility-administered encounter medications on file as of 07/22/2020.    Allergies as of 07/22/2020 - Review Complete 07/22/2020  Allergen Reaction Noted  . Adhesive [tape]  12/11/2014    Past Medical History:  Diagnosis Date  . Arthritis    Neck C3,C4, C5  . GERD without esophagitis 2016   NEG CARDIAC EVALUATION [facility]    Past Surgical History:  Procedure Laterality Date  . CHOLECYSTECTOMY  2006   GB FULL OF "SAND", NEEDED A DRAIN DUE TO INFECTION  . COLONOSCOPY  [facility]  . COLONOSCOPY N/A 10/14/2014   Procedure: COLONOSCOPY;  Surgeon: Danie Binder, MD;  [facility];  Service: Endoscopy;  Laterality: N/A;  100pm  . ESOPHAGOGASTRODUODENOSCOPY N/A 10/14/2014   Procedure: ESOPHAGOGASTRODUODENOSCOPY (EGD);  Surgeon: Danie Binder, MD;  [facility];  Service: Endoscopy;  Laterality: N/A;  . HEMORRHOID BANDING N/A 10/14/2014   Procedure: HEMORRHOID BANDING;  Surgeon: Danie Binder, MD;  [facility];  Service: Endoscopy;  Laterality: N/A;  . HERNIA REPAIR  [id number]   COMPLICATED BY PROLONGED ILEUS    Family History  Problem Relation Age of Onset  . Colon polyps Mother   . Kidney disease Mother   . Colon cancer Maternal Grandmother     Social History   Socioeconomic History  . Marital status: Married    Spouse name: Not on file  . Number of children: Not on file  . Years of education: Not on file  . Highest education level: Not on file  Occupational History  . Not on file  Tobacco Use  . Smoking status: Never Smoker  . Smokeless tobacco: Never Used  Vaping Use  . Vaping Use: Never used  Substance and Sexual Activity  . Alcohol use: Never    Alcohol/week: 0.0 standard drinks  . Drug use: Never  . Sexual activity: Not on file  Other Topics Concern  . Not on file  Social History Narrative   Part owner  OF [employer]   Social Determinants of Health   Financial Resource Strain:   . Difficulty of Paying Living Expenses: Not on file  Food Insecurity:   . Worried About Charity fundraiser in the Last Year: Not on file  . Ran Out of Food in the Last Year: Not on file  Transportation Needs:   . Lack of Transportation (Medical): Not on file  . Lack of Transportation (Non-Medical): Not on file  Physical Activity:   . Days of Exercise per Week: Not on file  . Minutes of Exercise per Session: Not on file  Stress:   . Feeling of Stress : Not on file  Social Connections:   . Frequency of Communication with Friends and Family: Not on file  . Frequency of Social Gatherings with  Friends and Family: Not on file  . Attends Religious Services: Not on file  . Active Member of Clubs or Organizations: Not on file  . Attends Archivist Meetings: Not on file  . Marital Status: Not on file  Intimate Partner Violence:   . Fear of Current or Ex-Partner: Not on file  . Emotionally Abused: Not on file  . Physically Abused: Not on file  . Sexually Abused: Not on file    Review of Systems  Constitutional: Negative for fatigue.  Respiratory: Positive for apnea. Negative for shortness of breath.   Psychiatric/Behavioral: Positive for sleep disturbance.    Vitals:   07/22/20 1514  BP: 126/70  Pulse: 84  Temp: 98 F (36.7 C)  SpO2: 98%     Physical Exam Constitutional:      Appearance: He is obese.  HENT:     Nose: No congestion.     Mouth/Throat:     Mouth: Mucous membranes are moist.     Pharynx: No oropharyngeal exudate.     Comments: Crowded oropharynx, Mallampati 4 Eyes:     General:        Right eye: No discharge.        Left eye: No discharge.  Cardiovascular:     Rate and Rhythm: Normal rate and regular rhythm.     Heart sounds: No murmur heard.  No friction rub.  Pulmonary:     Effort: No respiratory distress.     Breath sounds: No stridor. No wheezing or rhonchi.  Musculoskeletal:     Cervical back: No rigidity or tenderness.  Neurological:     Mental Status: He is alert.  Psychiatric:        Mood and Affect: Mood normal.    Results of the Epworth flowsheet 07/22/2020  Sitting and reading 1  Watching TV 2  Sitting, inactive in a public place (e.g. a theatre or a meeting) 1  As a passenger in a car for an hour without a break 1  Lying down to rest in the afternoon when circumstances permit 3  Sitting and talking to someone 0  Sitting quietly after a lunch without alcohol 0  In a car, while stopped for a few minutes in traffic 0  Total score 8   Data Reviewed: Recent echocardiogram November 2021 was within normal  limits  Assessment:  History of severe obstructive sleep apnea -Was intolerant to CPAP therapy  Pathophysiology of sleep disordered breathing discussed with the patient Treatment options for sleep disordered breathing discussed with the patient Multiple options including CPAP therapy Did discuss an oral device as an option of treatment-with a history of severe obstructive sleep apnea an oral  device would not be optimal treatment however if he is not able to tolerate CPAP therapy at all, then this may be an option An inspire device was discussed-with his super obesity, will not be an ideal candidate because of his BMI of 40 Surgery for obstructive sleep apnea will also have to be considered depending on severity of his sleep disordered breathing and the risks with not treating it  Plan/Recommendations: Schedule patient for home sleep study  Treatment options already discussed with the patient  Encouraged weight loss measures  Tentative follow-up in about 3 months   Sherrilyn Rist MD [facility] 07/22/2020, 3:42 PM  CC: Lelon Perla, MD

## 2020-07-22 NOTE — Patient Instructions (Signed)
Schedule patient for a home sleep study  Encourage weight loss efforts  We will update you with results of sleep study once reviewed  Call with significant concerns

## 2020-08-26 ENCOUNTER — Other Ambulatory Visit: Payer: Self-pay

## 2020-08-26 ENCOUNTER — Ambulatory Visit: Payer: Self-pay

## 2020-08-26 DIAGNOSIS — G4733 Obstructive sleep apnea (adult) (pediatric): Secondary | ICD-10-CM | POA: Diagnosis not present

## 2020-09-03 ENCOUNTER — Telehealth: Payer: Self-pay | Admitting: Pulmonary Disease

## 2020-09-03 DIAGNOSIS — G4733 Obstructive sleep apnea (adult) (pediatric): Secondary | ICD-10-CM

## 2020-09-03 NOTE — Telephone Encounter (Signed)
Call patient  Sleep study result  Date of study:   Impression: Severe obstructive sleep apnea Mild oxygen desaturations  Recommendation: DME referral  Recommend CPAP therapy for severe obstructive sleep apnea  Auto titrating CPAP with pressure settings of 5-20 will be appropriate  Encourage weight loss measures  Follow-up in the office 4 to 6 weeks following initiation of treatment

## 2020-09-04 NOTE — Telephone Encounter (Signed)
Called and spoke with patient to let him know results of sleep study per Dr. Ander Slade. Patient states that he would like to proceed with CPAP. Order has been placed. Advised patient there is currently a backorder on machines so that it would probably be about a month before he heard anything. Patient expressed understanding. Patient also states that previously when he tried CPAP he had a lot of trouble finding a mask that would work for him and not lose suction after 2 hours. States he is willing to try it again and see if it will work if not he would like to be referred to see if he is a candidate for the oral device. Will route to dr. Ander Slade as Juluis Rainier. Nothing further needed at this time.

## 2020-09-05 NOTE — Telephone Encounter (Signed)
Oral device is not recommended as a treatment for severe obstructive sleep apnea.  Focus should be on adjusting to CPAP and aggressive weight loss  I don't mind referring him to a dentist if he feels that he will not be able to use CPAP

## 2020-09-05 NOTE — Telephone Encounter (Signed)
Spoke with the pt and notified of response per Dr Ander Slade  He verbalized understanding and will call if needed if not able to tolerate  Nothing further needed at this time

## 2020-10-16 ENCOUNTER — Encounter (INDEPENDENT_AMBULATORY_CARE_PROVIDER_SITE_OTHER): Payer: Self-pay | Admitting: *Deleted

## 2020-11-10 ENCOUNTER — Other Ambulatory Visit: Payer: Self-pay

## 2020-11-10 ENCOUNTER — Encounter: Payer: Self-pay | Admitting: Pulmonary Disease

## 2020-11-10 ENCOUNTER — Encounter (INDEPENDENT_AMBULATORY_CARE_PROVIDER_SITE_OTHER): Payer: Self-pay | Admitting: *Deleted

## 2020-11-10 ENCOUNTER — Ambulatory Visit: Payer: BC Managed Care – PPO | Admitting: Pulmonary Disease

## 2020-11-10 VITALS — BP 133/82 | Temp 97.4°F | Ht 71.0 in | Wt 288.0 lb

## 2020-11-10 DIAGNOSIS — G4733 Obstructive sleep apnea (adult) (pediatric): Secondary | ICD-10-CM | POA: Diagnosis not present

## 2020-11-10 NOTE — Patient Instructions (Signed)
Severe obstructive sleep apnea  Though not optimal for treatment of severe obstructive sleep apnea, We will refer you to Dr. Sharyn Dross to evaluate for an oral device -Agree that 6 months of waiting without any intervention is not the best approach as well  Encourage you to continue working on weight loss efforts  Call with significant concerns

## 2020-11-10 NOTE — Progress Notes (Signed)
Bryan Austin    [id number]    01/04/1961  Primary Care Physician:Austin, Bryan Dienes, MD  Referring Physician: Ranae Austin, [place] [id number]  Chief complaint:  Patient being seen for history of obstructive sleep apnea  HPI: Patient noted to have severe obstructive sleep apnea He was told I would schedule take about 6 months to get him a new machine  He wants to be evaluated for an oral device -He will like to see Dr. Ron Austin  Previous diagnosis of obstructive sleep apnea about 3 to 4 years ago -Studies not available to be reviewed -He stated that he had severe obstructive sleep apnea  He did try CPAP but was not able to tolerate it, had issues with multiple masks  Significantly in the past few years weight has gone up significantly the past few years, thyroid function tests have been negative  Sleep is nonrestorative Dry mouth No headaches   Outpatient Encounter Medications as of 11/10/2020  Medication Sig  . albuterol (VENTOLIN HFA) 108 (90 Base) MCG/ACT inhaler INHALE ONE TO TWO PUFFS EVERY 4 TO 6 HOURS AS NEEDED FOR COUGH/WHEEZE  . colchicine 0.6 MG tablet Take 0.6 mg by mouth daily as needed.  . cyanocobalamin 2000 MCG tablet Take 3,000 mcg by mouth daily.  . empagliflozin (JARDIANCE) 10 MG TABS tablet Take 10 mg by mouth daily.  . furosemide (LASIX) 40 MG tablet Take 40 mg by mouth daily as needed.   . Melatonin 5 MG TABS Take 5 mg by mouth at bedtime.  . metFORMIN (GLUCOPHAGE-XR) 500 MG 24 hr tablet Take 500 mg by mouth daily. Pt takes in the mornings.   No facility-administered encounter medications on file as of 11/10/2020.    Allergies as of 11/10/2020 - Review Complete 11/10/2020  Allergen Reaction Noted  . Adhesive [tape]  12/11/2014    Past Medical History:  Diagnosis Date  . Arthritis    Neck C3,C4, C5  . GERD without esophagitis 2016   [facility]G CARDIAC EVALUATION [facility]    Past Surgical History:  Procedure Laterality  Date  . CHOLECYSTECTOMY  2006   GB FULL OF "SAND", NEEDED A DRAIN DUE TO INFECTION  . COLONOSCOPY  [facility]  . COLONOSCOPY N/A 10/14/2014   Procedure: COLONOSCOPY;  Surgeon: Danie Binder, MD;  [facility];  Service: Endoscopy;  Laterality: N/A;  100pm  . ESOPHAGOGASTRODUODENOSCOPY N/A 10/14/2014   Procedure: ESOPHAGOGASTRODUODENOSCOPY (EGD);  Surgeon: Danie Binder, MD;  [facility];  Service: Endoscopy;  Laterality: N/A;  . HEMORRHOID BANDING N/A 10/14/2014   Procedure: HEMORRHOID BANDING;  Surgeon: Danie Binder, MD;  [facility];  Service: Endoscopy;  Laterality: N/A;  . HERNIA REPAIR  [id number]   COMPLICATED BY PROLONGED ILEUS    Family History  Problem Relation Age of Onset  . Colon polyps Mother   . Kidney disease Mother   . Colon cancer Maternal Grandmother     Social History   Socioeconomic History  . Marital status: Married    Spouse name: Not on file  . Number of children: Not on file  . Years of education: Not on file  . Highest education level: Not on file  Occupational History  . Not on file  Tobacco Use  . Smoking status: Never Smoker  . Smokeless tobacco: Never Used  Vaping Use  . Vaping Use: Never used  Substance and Sexual Activity  . Alcohol  use: Never    Alcohol/week: 0.0 standard drinks  . Drug use: Never  . Sexual activity: Not on file  Other Topics Concern  . Not on file  Social History Narrative   Part owner OF [employer]   Social Determinants of Health   Financial Resource Strain: Not on file  Food Insecurity: Not on file  Transportation Needs: Not on file  Physical Activity: Not on file  Stress: Not on file  Social Connections: Not on file  Intimate Partner Violence: Not on file    Review of Systems  Constitutional: Negative for fatigue.  Respiratory: Positive for apnea. Negative for shortness of breath.   Psychiatric/Behavioral: Positive for sleep disturbance.    Vitals:   11/10/20 1405  BP:  133/82  Temp: (!) 97.4 F (36.3 C)     Physical Exam Constitutional:      Appearance: He is obese.  HENT:     Nose: No congestion.     Mouth/Throat:     Mouth: Mucous membranes are moist.     Pharynx: No oropharyngeal exudate.     Comments: Crowded oropharynx, Mallampati 4 Eyes:     General: No scleral icterus.       Right eye: No discharge.        Left eye: No discharge.  Cardiovascular:     Rate and Rhythm: Normal rate and regular rhythm.     Heart sounds: No murmur heard. No friction rub.  Pulmonary:     Effort: No respiratory distress.     Breath sounds: No stridor. No wheezing or rhonchi.  Musculoskeletal:     Cervical back: No rigidity or tenderness.  Neurological:     Mental Status: He is alert.  Psychiatric:        Mood and Affect: Mood normal.    Results of the Epworth flowsheet 07/22/2020  Sitting and reading 1  Watching TV 2  Sitting, inactive in a public place (e.g. a theatre or a meeting) 1  As a passenger in a car for an hour without a break 1  Lying down to rest in the afternoon when circumstances permit 3  Sitting and talking to someone 0  Sitting quietly after a lunch without alcohol 0  In a car, while stopped for a few minutes in traffic 0  Total score 8   Data Reviewed: Recent echocardiogram November 2021 was within normal limits  Assessment:  History of severe obstructive sleep apnea -Was intolerant to CPAP therapy -Repeat study still revealed severe obstructive sleep apnea  It is going to take about 6 months before he gets a new machine and will like to try an oral device at present  Wants to be referred to Dr. Ron Austin  Pathophysiology of sleep disordered breathing discussed with the patient Treatment options for sleep disordered breathing discussed with the patient Multiple options including CPAP therapy  He is aware of all the options of treatment  Plan/Recommendations: Referral to dentist for an oral device  Encouraged weight loss  measures  Tentative follow-up in about 3 months  Encouraged to call with any significant concerns   Sherrilyn Rist MD [facility] 11/10/2020, 2:18 PM  CC: Bryan Palms, MD

## 2020-12-09 ENCOUNTER — Other Ambulatory Visit (INDEPENDENT_AMBULATORY_CARE_PROVIDER_SITE_OTHER): Payer: Self-pay | Admitting: *Deleted

## 2020-12-09 ENCOUNTER — Ambulatory Visit (INDEPENDENT_AMBULATORY_CARE_PROVIDER_SITE_OTHER): Payer: BC Managed Care – PPO | Admitting: Gastroenterology

## 2020-12-09 ENCOUNTER — Encounter (INDEPENDENT_AMBULATORY_CARE_PROVIDER_SITE_OTHER): Payer: Self-pay | Admitting: *Deleted

## 2020-12-09 ENCOUNTER — Other Ambulatory Visit: Payer: Self-pay

## 2020-12-09 ENCOUNTER — Encounter (INDEPENDENT_AMBULATORY_CARE_PROVIDER_SITE_OTHER): Payer: Self-pay | Admitting: Gastroenterology

## 2020-12-09 DIAGNOSIS — Z860100 Personal history of colon polyps, unspecified: Secondary | ICD-10-CM | POA: Insufficient documentation

## 2020-12-09 DIAGNOSIS — K5900 Constipation, unspecified: Secondary | ICD-10-CM | POA: Diagnosis not present

## 2020-12-09 DIAGNOSIS — K644 Residual hemorrhoidal skin tags: Secondary | ICD-10-CM | POA: Insufficient documentation

## 2020-12-09 NOTE — Patient Instructions (Addendum)
Schedule colonoscopy Eat prune and/or kiwi daily If no improvement with fruits, Start Benefiber fiber supplements daily to improve bowel movements Will consider referral to surgery for hemorrhoidectomy based on findings in colonoscopy

## 2020-12-09 NOTE — Progress Notes (Signed)
Bryan Austin, M.D. Gastroenterology & Hepatology [facility] [address] [place], [place] [address] Primary Care Physician: Ranae Palms, MD [name] [id number]  Referring MD: PCP  Chief Complaint: Rectal bleeding and constipation  History of Present Illness: Bryan Austin is a 60 y.o. male with PMH DM, gout, bicuspid aortic valve, who presents for evaluation of rectal bleeding and constipation.  Patient reports that he has had recurrent rectal bleeding ofr the last few months. It has been intermittent but has presented heavy bleeding with his stool. He had problems with this in the past and was evaluated by Dr. Oneida Alar, it was felt his bleeding was secondary to external hemorrhoids.  He states that after some time the bleeding episodes went away but have recurred recently so he would like to get further input regarding treatment.  Patient reports most of the time he has daily bowel movements. For the last few months he has had some episodes of straining when moving his bowels but is still having a BM every day. He had some rectal pain recently when having BMs. Does not take any laxatives as he is scared he will have diarrhea due to this. He has presented some blaoting in the past, but he has flatulence frequently.   The patient denies having any nausea, vomiting, fever, chills,  melena, hematemesis, abdominal distention, abdominal pain, diarrhea, jaundice, pruritus or weight loss.  Last EGD:2016 1. NORMAL ESOPHAGUS 2. MILD Erosive gastritis  Last Colonoscopy:  2016 1. The LEFT colon is redundant 2. ONE COLONPOLYP REMOVED - tubular adenoma 3. SMALL Internal hemorrhoids 4. Moderate sized external hemorrhoids  FHx: neg for any gastrointestinal/liver disease, grandmother colon cancer, mother small cell lung cancer,  Social: neg smoking, alcohol or illicit drug use Surgical: abdominal hernia repair,  cholecystectomy  Past Medical History: Past Medical History:  Diagnosis Date  . Arthritis    Neck C3,C4, C5  . GERD without esophagitis 2016   [facility] CARD[facility]AC EVALUATION [facility]    Past Surgical History: Past Surgical History:  Procedure Laterality Date  . CHOLECYSTECTOMY  2006   GB FULL OF "SAND", NEEDED A DRAIN DUE TO INFECTION  . COLONOSCOPY  [facility]  . COLONOSCOPY N/A 10/14/2014   Procedure: COLONOSCOPY;  Surgeon: Danie Binder, MD;  [facility];  Service: Endoscopy;  Laterality: N/A;  100pm  . ESOPHAGOGASTRODUODENOSCOPY N/A 10/14/2014   Procedure: ESOPHAGOGASTRODUODENOSCOPY (EGD);  Surgeon: Danie Binder, MD;  [facility];  Service: Endoscopy;  Laterality: N/A;  . HEMORRHOID BANDING N/A 10/14/2014   Procedure: HEMORRHOID BANDING;  Surgeon: Danie Binder, MD;  [facility];  Service: Endoscopy;  Laterality: N/A;  . HERNIA REPAIR  [id number]   COMPLICATED BY PROLONGED ILEUS    Family History: Family History  Problem Relation Age of Onset  . Colon polyps Mother   . Kidney disease Mother   . Colon cancer Maternal Grandmother     Social History: Social History   Tobacco Use  Smoking Status Never Smoker  Smokeless Tobacco Never Used   Social History   Substance and Sexual Activity  Alcohol Use Never  . Alcohol/week: 0.0 standard drinks   Social History   Substance and Sexual Activity  Drug Use Never    Allergies: Allergies  Allergen Reactions  . Adhesive [Tape]     RASH, ITCHING    Medications: Current Outpatient Medications  Medication Sig Dispense Refill  . albuterol (VENTOLIN HFA) 108 (90  Base) MCG/ACT inhaler INHALE ONE TO TWO PUFFS EVERY 4 TO 6 HOURS AS NEEDED FOR COUGH/WHEEZE    . colchicine 0.6 MG tablet Take 0.6 mg by mouth daily as needed.    . cyanocobalamin 2000 MCG tablet Take 3,000 mcg by mouth daily.    . empagliflozin (JARDIANCE) 10 MG TABS tablet Take 10 mg by mouth daily.    . furosemide (LASIX) 40 MG tablet Take 40  mg by mouth daily as needed.     . Melatonin 5 MG TABS Take 5 mg by mouth at bedtime.    . metFORMIN (GLUCOPHAGE-XR) 500 MG 24 hr tablet Take 500 mg by mouth daily. Pt takes in the mornings.     No current facility-administered medications for this visit.    Review of Systems: GENERAL: negative for malaise, night sweats HEENT: No changes in hearing or vision, no nose bleeds or other nasal problems. NECK: Negative for lumps, goiter, pain and significant neck swelling RESPIRATORY: Negative for cough, wheezing CARDIOVASCULAR: Negative for chest pain, leg swelling, palpitations, orthopnea GI: SEE HPI MUSCULOSKELETAL: Negative for joint pain or swelling, back pain, and muscle pain. SKIN: Negative for lesions, rash PSYCH: Negative for sleep disturbance, mood disorder and recent psychosocial stressors. HEMATOLOGY Negative for prolonged bleeding, bruising easily, and swollen nodes. ENDOCRINE: Negative for cold or heat intolerance, polyuria, polydipsia and goiter. NEURO: negative for tremor, gait imbalance, syncope and seizures. The remainder of the review of systems is noncontributory.   Physical Exam: BP (!) 150/81 (BP Location: Left Arm, Patient Position: Sitting, Cuff Size: Large)   Pulse 83   Temp (!) 97.5 F (36.4 C) (Oral)   Ht 5\' 11"  (1.803 m)   Wt 293 lb (132.9 kg)   BMI 40.87 kg/m  GENERAL: The patient is AO x3, in no acute distress. Obese. HEENT: Head is normocephalic and atraumatic. EOMI are intact. Mouth is well hydrated and without lesions. NECK: Supple. No masses LUNGS: Clear to auscultation. No presence of rhonchi/wheezing/rales. Adequate chest expansion HEART: RRR, normal s1 and s2. ABDOMEN: Soft, nontender, no guarding, no peritoneal signs, and nondistended. BS +. No masses. EXTREMITIES: Without any cyanosis, clubbing, rash, lesions or edema. NEUROLOGIC: AOx3, no focal motor deficit. SKIN: no jaundice, no rashes   Imaging/Labs: as above  I personally reviewed  and interpreted the available labs, imaging and endoscopic files.  Impression and Plan: Bryan Austin is a 60 y.o. male with PMH DM, gout, bicuspid aortic valve, who presents for evaluation of rectal bleeding and constipation.  Patient has presented recurrent episodes of rectal bleeding which I consider very likely related to the presence of external hemorrhoids, with exacerbated bleeding in the setting of worsening constipation recently.  I advised the patient to start taking more fiber in his diet but if this does not help then he will need to start Benefiber supplements daily.  We will schedule colonoscopy as he is due for a repeat given his history of tubular adenoma.  The patient will like to have this performed before being referred to general surgery for possible hemorrhoidectomy.  - Schedule colonoscopy - Eat prune and/or kiwi daily - If no improvement with fruits, patient will start Benefiber fiber supplements daily to improve bowel movements - Will consider referral to surgery for hemorrhoidectomy based on findings in colonoscopy  All questions were answered.      Bryan Peppers, MD Gastroenterology and Hepatology [facility]

## 2020-12-22 ENCOUNTER — Other Ambulatory Visit (HOSPITAL_COMMUNITY)
Admission: RE | Admit: 2020-12-22 | Discharge: 2020-12-22 | Disposition: A | Discharge status: Home / Self Care | Payer: BC Managed Care – PPO | Source: Ambulatory Visit | Attending: Gastroenterology | Admitting: Gastroenterology

## 2020-12-22 ENCOUNTER — Other Ambulatory Visit: Payer: Self-pay

## 2020-12-22 DIAGNOSIS — U071 COVID-19: Secondary | ICD-10-CM | POA: Diagnosis not present

## 2020-12-22 DIAGNOSIS — Z01812 Encounter for preprocedural laboratory examination: Secondary | ICD-10-CM | POA: Diagnosis not present

## 2020-12-22 LAB — BASIC METABOLIC PANEL
Anion gap: 7 (ref 5–15)
BUN: 21 mg/dL (ref 8–23)
CO2: 27 mmol/L (ref 22–32)
Calcium: 9.1 mg/dL (ref 8.9–10.3)
Chloride: 106 mmol/L (ref 98–111)
Creatinine, Ser: 1.57 mg/dL — ABNORMAL HIGH (ref 0.61–1.24)
GFR, Estimated: 50 mL/min — ABNORMAL LOW (ref >60)
Glucose, Bld: 158 mg/dL — ABNORMAL HIGH (ref 70–99)
Potassium: 4.4 mmol/L (ref 3.5–5.1)
Sodium: 140 mmol/L (ref 135–145)

## 2020-12-23 LAB — SARS CORONAVIRUS 2 (TAT 6-24 HRS): SARS Coronavirus 2: POSITIVE — AB

## 2020-12-24 ENCOUNTER — Telehealth: Payer: Self-pay

## 2020-12-24 ENCOUNTER — Ambulatory Visit (HOSPITAL_COMMUNITY)
Admission: RE | Admit: 2020-12-24 | Payer: BC Managed Care – PPO | Source: Home / Self Care | Admitting: Gastroenterology

## 2020-12-24 ENCOUNTER — Encounter (HOSPITAL_COMMUNITY): Admission: RE | Payer: Self-pay | Source: Home / Self Care

## 2020-12-24 SURGERY — COLONOSCOPY WITH PROPOFOL
Anesthesia: Monitor Anesthesia Care

## 2020-12-24 NOTE — Telephone Encounter (Signed)
Called to discuss with patient about COVID-19 symptoms and the use of one of the available treatments for those with mild to moderate Covid symptoms and at a high risk of hospitalization.  Pt appears to qualify for outpatient treatment due to co-morbid conditions and/or a member of an at-risk group in accordance with the FDA Emergency Use Authorization.    Symptom onset: None Vaccinated: Yes Booster? No Immunocompromised? No Qualifiers: HTN NIH Criteria:   Pt. Reports he had covid last month and still showing positive. Bryan Austin

## 2021-01-28 ENCOUNTER — Other Ambulatory Visit (INDEPENDENT_AMBULATORY_CARE_PROVIDER_SITE_OTHER): Payer: Self-pay

## 2021-01-28 DIAGNOSIS — Z01812 Encounter for preprocedural laboratory examination: Secondary | ICD-10-CM

## 2021-02-20 ENCOUNTER — Other Ambulatory Visit: Payer: Self-pay

## 2021-02-20 ENCOUNTER — Other Ambulatory Visit (HOSPITAL_COMMUNITY)
Admission: RE | Admit: 2021-02-20 | Discharge: 2021-02-20 | Disposition: A | Discharge status: Home / Self Care | Payer: BC Managed Care – PPO | Source: Ambulatory Visit | Attending: Gastroenterology | Admitting: Gastroenterology

## 2021-02-20 DIAGNOSIS — Z01812 Encounter for preprocedural laboratory examination: Secondary | ICD-10-CM | POA: Diagnosis present

## 2021-02-20 LAB — BASIC METABOLIC PANEL
Anion gap: 7 (ref 5–15)
BUN: 18 mg/dL (ref 8–23)
CO2: 28 mmol/L (ref 22–32)
Calcium: 9.4 mg/dL (ref 8.9–10.3)
Chloride: 104 mmol/L (ref 98–111)
Creatinine, Ser: 1.17 mg/dL (ref 0.61–1.24)
GFR, Estimated: >60 mL/min (ref >60)
Glucose, Bld: 113 mg/dL — ABNORMAL HIGH (ref 70–99)
Potassium: 4.4 mmol/L (ref 3.5–5.1)
Sodium: 139 mmol/L (ref 135–145)

## 2021-02-24 ENCOUNTER — Ambulatory Visit (HOSPITAL_COMMUNITY): Payer: BC Managed Care – PPO | Admitting: Anesthesiology

## 2021-02-24 ENCOUNTER — Encounter (HOSPITAL_COMMUNITY)
Admission: RE | Disposition: A | Discharge status: Home / Self Care | Payer: Self-pay | Source: Home / Self Care | Attending: Gastroenterology

## 2021-02-24 ENCOUNTER — Other Ambulatory Visit: Payer: Self-pay

## 2021-02-24 ENCOUNTER — Ambulatory Visit (HOSPITAL_COMMUNITY)
Admission: RE | Admit: 2021-02-24 | Discharge: 2021-02-24 | Disposition: A | Discharge status: Home / Self Care | Payer: BC Managed Care – PPO | Attending: Gastroenterology | Admitting: Gastroenterology

## 2021-02-24 ENCOUNTER — Encounter (HOSPITAL_COMMUNITY): Payer: Self-pay | Admitting: Gastroenterology

## 2021-02-24 DIAGNOSIS — Z841 Family history of disorders of kidney and ureter: Secondary | ICD-10-CM | POA: Diagnosis not present

## 2021-02-24 DIAGNOSIS — K5730 Diverticulosis of large intestine without perforation or abscess without bleeding: Secondary | ICD-10-CM

## 2021-02-24 DIAGNOSIS — D124 Benign neoplasm of descending colon: Secondary | ICD-10-CM

## 2021-02-24 DIAGNOSIS — Z888 Allergy status to other drugs, medicaments and biological substances status: Secondary | ICD-10-CM | POA: Insufficient documentation

## 2021-02-24 DIAGNOSIS — K648 Other hemorrhoids: Secondary | ICD-10-CM

## 2021-02-24 DIAGNOSIS — Z86010 Personal history of colonic polyps: Secondary | ICD-10-CM

## 2021-02-24 DIAGNOSIS — E119 Type 2 diabetes mellitus without complications: Secondary | ICD-10-CM | POA: Diagnosis not present

## 2021-02-24 DIAGNOSIS — D125 Benign neoplasm of sigmoid colon: Secondary | ICD-10-CM

## 2021-02-24 DIAGNOSIS — Z8371 Family history of colonic polyps: Secondary | ICD-10-CM | POA: Insufficient documentation

## 2021-02-24 DIAGNOSIS — K644 Residual hemorrhoidal skin tags: Secondary | ICD-10-CM | POA: Insufficient documentation

## 2021-02-24 DIAGNOSIS — M109 Gout, unspecified: Secondary | ICD-10-CM | POA: Insufficient documentation

## 2021-02-24 DIAGNOSIS — Z7984 Long term (current) use of oral hypoglycemic drugs: Secondary | ICD-10-CM | POA: Diagnosis not present

## 2021-02-24 DIAGNOSIS — Z1211 Encounter for screening for malignant neoplasm of colon: Secondary | ICD-10-CM | POA: Insufficient documentation

## 2021-02-24 DIAGNOSIS — Z791 Long term (current) use of non-steroidal anti-inflammatories (NSAID): Secondary | ICD-10-CM | POA: Diagnosis not present

## 2021-02-24 DIAGNOSIS — Z79899 Other long term (current) drug therapy: Secondary | ICD-10-CM | POA: Diagnosis not present

## 2021-02-24 HISTORY — PX: POLYPECTOMY: SHX5525

## 2021-02-24 HISTORY — PX: HEMOSTASIS CLIP PLACEMENT: SHX6857

## 2021-02-24 HISTORY — PX: COLONOSCOPY WITH PROPOFOL: SHX5780

## 2021-02-24 LAB — GLUCOSE, CAPILLARY: Glucose-Capillary: 126 mg/dL — ABNORMAL HIGH (ref 70–99)

## 2021-02-24 LAB — HM COLONOSCOPY

## 2021-02-24 SURGERY — COLONOSCOPY WITH PROPOFOL
Anesthesia: General

## 2021-02-24 MED ORDER — PROPOFOL 500 MG/50ML IV EMUL
INTRAVENOUS | Status: DC | PRN
  Administered 2021-02-24: 150 ug/kg/min via INTRAVENOUS

## 2021-02-24 MED ORDER — PROPOFOL 10 MG/ML IV BOLUS
INTRAVENOUS | Status: DC | PRN
  Administered 2021-02-24 (×2): 50 mg via INTRAVENOUS
  Administered 2021-02-24: 100 mg via INTRAVENOUS

## 2021-02-24 MED ORDER — PHENYLEPHRINE 40 MCG/ML (10ML) SYRINGE FOR IV PUSH (FOR BLOOD PRESSURE SUPPORT)
PREFILLED_SYRINGE | INTRAVENOUS | Status: AC
  Filled 2021-02-24: qty 10

## 2021-02-24 MED ORDER — STERILE WATER FOR IRRIGATION IR SOLN
Status: DC | PRN
  Administered 2021-02-24: 100 mL

## 2021-02-24 MED ORDER — PROPOFOL 10 MG/ML IV BOLUS
INTRAVENOUS | Status: AC
  Filled 2021-02-24: qty 60

## 2021-02-24 MED ORDER — LACTATED RINGERS IV SOLN: INTRAVENOUS | Status: DC

## 2021-02-24 NOTE — H&P (Signed)
Bryan Austin is an 60 y.o. male.   Chief Complaint: History of colon polyps HPI: Bryan Austin is a 60 y.o. male with PMH DM, gout, bicuspid aortic valve, who presents for evaluation of history of colon polyps.  The patient states that he used to have rectal bleeding episodes in the past when he was constipated but after he implemented fiber in his food he has not presented any more episodes of rectal bleeding.  Denies any abdominal pain, nausea, vomiting, fever, chills, abdominal distention, melena or hematochezia.  Last Colonoscopy:  2016 1. The LEFT colon is redundant 2. ONE COLONPOLYP REMOVED - tubular adenoma 3. SMALL Internal hemorrhoids 4. Moderate sized external hemorrhoids   FHx: neg for any gastrointestinal/liver disease, grandmother colon cancer, mother small cell lung cancer,   Past Medical History:  Diagnosis Date   Arthritis    Neck C3,C4, C5   GERD without esophagitis 2016   NEG CARDIAC EVALUATION [place]    Past Surgical History:  Procedure Laterality Date   CHOLECYSTECTOMY  2006   GB FULL OF "SAND", NEEDED A DRAIN DUE TO INFECTION   COLONOSCOPY  [facility]   COLONOSCOPY N/A 10/14/2014   Procedure: COLONOSCOPY;  Surgeon: Danie Binder, MD;  [facility];  Service: Endoscopy;  Laterality: N/A;  100pm   ESOPHAGOGASTRODUODENOSCOPY N/A 10/14/2014   Procedure: ESOPHAGOGASTRODUODENOSCOPY (EGD);  Surgeon: Danie Binder, MD;  [facility];  Service: Endoscopy;  Laterality: N/A;   HEMORRHOID BANDING N/A 10/14/2014   Procedure: HEMORRHOID BANDING;  Surgeon: Danie Binder, MD;  [facility];  Service: Endoscopy;  Laterality: N/A;   HERNIA REPAIR  [id number]   COMPLICATED BY PROLONGED ILEUS    Family History  Problem Relation Age of Onset   Colon polyps Mother    Kidney disease Mother    Colon cancer Maternal Grandmother    Social History:  reports that he has never smoked. He has never used smokeless tobacco. He reports that he does not drink  alcohol and does not use drugs.  Allergies:  Allergies  Allergen Reactions   Adhesive [Tape] Itching and Rash    Medications Prior to Admission  Medication Sig Dispense Refill   colchicine 0.6 MG tablet Take 0.6 mg by mouth daily as needed (gout).     Cyanocobalamin (B-12) 3000 MCG CAPS Take 6,000 mcg by mouth daily.     diclofenac (VOLTAREN) 75 MG EC tablet Take 75 mg by mouth daily.     empagliflozin (JARDIANCE) 10 MG TABS tablet Take 10 mg by mouth daily.     Melatonin 5 MG TABS Take 10 mg by mouth at bedtime.     metFORMIN (GLUCOPHAGE-XR) 500 MG 24 hr tablet Take 500 mg by mouth See admin instructions. Take 500 mg daily, may take a second 500 mg dose in the evening as needed for high blood sugar     triamcinolone cream (KENALOG) 0.1 % Apply 1 application topically daily.      No results found for this or any previous visit (from the past 48 hour(s)). No results found.  Review of Systems  Constitutional: Negative.   HENT: Negative.    Eyes: Negative.   Respiratory: Negative.    Cardiovascular: Negative.   Gastrointestinal: Negative.   Endocrine: Negative.   Genitourinary: Negative.   Musculoskeletal: Negative.   Skin: Negative.   Allergic/Immunologic: Negative.   Neurological: Negative.   Hematological: Negative.   Psychiatric/Behavioral: Negative.     Blood pressure (!) 155/77, temperature  97.9 F (36.6 C), temperature source Oral, resp. rate 14, height 5\' 11"  (1.803 m), SpO2 96 %. Physical Exam  GENERAL: The patient is AO x3, in no acute distress. HEENT: Head is normocephalic and atraumatic. EOMI are intact. Mouth is well hydrated and without lesions. NECK: Supple. No masses LUNGS: Clear to auscultation. No presence of rhonchi/wheezing/rales. Adequate chest expansion HEART: RRR, normal s1 and s2. ABDOMEN: Soft, nontender, no guarding, no peritoneal signs, and nondistended. BS +. No masses. EXTREMITIES: Without any cyanosis, clubbing, rash, lesions or  edema. NEUROLOGIC: AOx3, no focal motor deficit. SKIN: no jaundice, no rashes  Assessment/Plan Bryan Austin is a 60 y.o. male with PMH DM, gout, bicuspid aortic valve, who presents for evaluation of history of colon polyps.  We will proceed with colonoscopy.  Harvel Quale, MD 02/24/2021, 7:34 AM

## 2021-02-24 NOTE — Op Note (Signed)
[facility] Patient Name: Bryan Austin Procedure Date: 02/24/2021 8:47 AM MRN: [id number] Date of Birth: 1961-04-01 Attending MD: [name] ,  CSN: [id number] Age: 60 Admit Type: Outpatient Procedure:                Colonoscopy Indications:              High risk colon cancer surveillance: Personal                            history of colonic polyps Providers:                [name], Lambert Mody, Kristine L.                            Risa Grill, Technician Referring MD:              Medicines:                Monitored Anesthesia Care Complications:            No immediate complications. Estimated Blood Loss:     Estimated blood loss: none. Procedure:                Pre-Anesthesia Assessment:                           - Prior to the procedure, a History and Physical                            was performed, and patient medications, allergies                            and sensitivities were reviewed. The patient's                            tolerance of previous anesthesia was reviewed.                           - The risks and benefits of the procedure and the                            sedation options and risks were discussed with the                            patient. All questions were answered and informed                            consent was obtained.                           - ASA Grade Assessment: II - A patient with mild                            systemic disease.                           After obtaining informed consent, the colonoscope  was passed under direct vision. Throughout the                            procedure, the patient's blood pressure, pulse, and                            oxygen saturations were monitored continuously. The                            PCF-[id number] ([id number]) was introduced through the                            anus and advanced to the the cecum, identified by                             appendiceal orifice and ileocecal valve. The                            colonoscopy was performed without difficulty. The                            patient tolerated the procedure well. The quality                            of the bowel preparation was adequate. Scope In: 9:01:45 AM Scope Out: 9:25:06 AM Scope Withdrawal Time: 0 hours 18 minutes 32 seconds  Total Procedure Duration: 0 hours 23 minutes 21 seconds  Findings:      Hemorrhoids and condylomas were found on perianal exam.      Two sessile polyps were found in the sigmoid colon and descending colon.       The polyps were 4 to 6 mm in size. These polyps were removed with a cold       snare. Resection and retrieval were complete. To prevent bleeding       post-intervention, one hemostatic clip was successfully placed in one of       the polypectomy sites. There was no bleeding at the end of the procedure.      A few small and large-mouthed diverticula were found in the sigmoid       colon.      Non-bleeding internal hemorrhoids were found during retroflexion. The       hemorrhoids were medium-sized. Impression:               - Hemorrhoids found on perianal exam.                           - Two 4 to 6 mm polyps in the sigmoid colon and in                            the descending colon, removed with a cold snare.                            Resected and retrieved. Clip was placed.                           -  Diverticulosis in the sigmoid colon.                           - Non-bleeding internal hemorrhoids. Moderate Sedation:      Per Anesthesia Care Recommendation:           - Discharge patient to home (ambulatory).                           - High fiber diet.                           - Await pathology results.                           - Repeat colonoscopy for surveillance based on                            pathology results. Procedure Code(s):        --- Professional ---                           [phone], Colonoscopy,  flexible; with removal of                            tumor(s), polyp(s), or other lesion(s) by snare                            technique Diagnosis Code(s):        --- Professional ---                           Z86.010, Personal history of colonic polyps                           K64.8, Other hemorrhoids                           K63.5, Polyp of colon                           K57.30, Diverticulosis of large intestine without                            perforation or abscess without bleeding CPT copyright 2019 American Medical Association. All rights reserved. The codes documented in this report are preliminary and upon coder review may  be revised to meet current compliance requirements. [name], MD [name],  02/24/2021 9:33:16 AM This report has been signed electronically. Number of Addenda: 0

## 2021-02-24 NOTE — Anesthesia Preprocedure Evaluation (Addendum)
Anesthesia Evaluation  Patient identified by MRN, date of birth, ID band Patient awake    Reviewed: Allergy & Precautions, NPO status , Patient's Chart, lab work & pertinent test results  History of Anesthesia Complications Negative for: history of anesthetic complications  Airway Mallampati: II  TM Distance: >3 FB Neck ROM: Full   Comment: C4 and 5 disc compression, neck pain,  received steroid injection Dental  (+) Dental Advisory Given, Caps   Pulmonary sleep apnea (use mouth gaurd) ,    Pulmonary exam normal breath sounds clear to auscultation       Cardiovascular Exercise Tolerance: Good hypertension, +CHF  Normal cardiovascular exam Rhythm:Regular Rate:Normal  1. Left ventricular ejection fraction, by estimation, is 70 to 75%. The  left ventricle has hyperdynamic function. The left ventricle has no  regional wall motion abnormalities. The left ventricular internal cavity  size was mildly dilated. Left  ventricular diastolic parameters were normal.  2. Right ventricular systolic function is normal. The right ventricular  size is normal.  3. The mitral valve is normal in structure. Mild mitral valve  regurgitation.  4. AV is difficult to see. Was reported bicuspid in past. It is mildly  sclerotic. Peak and mean gradients through the valve. Peak and mean  gradients through the valve are 20 and 10 mm Hg respectively, mildly  increased from echo report of Aug 2019.. The  aortic valve is abnormal. Aortic valve regurgitation is not visualized.  Mild aortic valve sclerosis is present, with no evidence of aortic valve  stenosis.    Neuro/Psych negative neurological ROS  negative psych ROS   GI/Hepatic Neg liver ROS, GERD  Medicated and Controlled,  Endo/Other  diabetes, Well Controlled, Type 2, Oral Hypoglycemic Agents  Renal/GU negative Renal ROS     Musculoskeletal  (+) Arthritis ,   Abdominal   Peds   Hematology negative hematology ROS (+)   Anesthesia Other Findings   Reproductive/Obstetrics                           Anesthesia Physical Anesthesia Plan  ASA: 3  Anesthesia Plan: General   Post-op Pain Management:    Induction: Intravenous  PONV Risk Score and Plan: Propofol infusion  Airway Management Planned: Nasal Cannula and Natural Airway  Additional Equipment:   Intra-op Plan:   Post-operative Plan:   Informed Consent: I have reviewed the patients History and Physical, chart, labs and discussed the procedure including the risks, benefits and alternatives for the proposed anesthesia with the patient or authorized representative who has indicated his/her understanding and acceptance.     Dental advisory given  Plan Discussed with: CRNA and Surgeon  Anesthesia Plan Comments:        Anesthesia Quick Evaluation

## 2021-02-24 NOTE — Anesthesia Postprocedure Evaluation (Signed)
Anesthesia Post Note  Patient: Bryan Austin  Procedure(s) Performed: COLONOSCOPY WITH PROPOFOL POLYPECTOMY  Patient location during evaluation: Endoscopy Anesthesia Type: General Level of consciousness: awake and alert Pain management: pain level controlled Vital Signs Assessment: post-procedure vital signs reviewed and stable Respiratory status: spontaneous breathing Cardiovascular status: blood pressure returned to baseline and stable Postop Assessment: no apparent nausea or vomiting Anesthetic complications: no   No notable events documented.   Last Vitals:  Vitals:   02/24/21 0723 02/24/21 0725  BP:  (!) 155/77  Resp: 14   Temp: 36.6 C   SpO2: 96%     Last Pain:  Vitals:   02/24/21 0859  TempSrc:   PainSc: 0-No pain                 Sakiya Stepka

## 2021-02-24 NOTE — Discharge Instructions (Signed)
You are being discharged to home.  Eat a high fiber diet.  We are waiting for your pathology results.  Your physician has recommended a repeat colonoscopy for surveillance based on pathology results.

## 2021-02-24 NOTE — Transfer of Care (Signed)
Immediate Anesthesia Transfer of Care Note  Patient: Bryan Austin  Procedure(s) Performed: COLONOSCOPY WITH PROPOFOL POLYPECTOMY  Patient Location: Endoscopy Unit  Anesthesia Type:General  Level of Consciousness: awake  Airway & Oxygen Therapy: Patient Spontanous Breathing  Post-op Assessment: Report given to RN  Post vital signs: Reviewed and stable  Last Vitals:  Vitals Value Taken Time  BP    Temp    Pulse    Resp    SpO2      Last Pain:  Vitals:   02/24/21 0859  TempSrc:   PainSc: 0-No pain         Complications: No notable events documented.

## 2021-02-25 LAB — SURGICAL PATHOLOGY

## 2021-02-26 ENCOUNTER — Ambulatory Visit (INDEPENDENT_AMBULATORY_CARE_PROVIDER_SITE_OTHER): Payer: BC Managed Care – PPO | Admitting: Gastroenterology

## 2021-02-26 ENCOUNTER — Encounter (INDEPENDENT_AMBULATORY_CARE_PROVIDER_SITE_OTHER): Payer: Self-pay | Admitting: *Deleted

## 2021-03-04 ENCOUNTER — Encounter (HOSPITAL_COMMUNITY): Payer: Self-pay | Admitting: Gastroenterology

## 2021-06-11 NOTE — Progress Notes (Signed)
HPI: FU bicuspid aortic valve. CTA June 2018 showed no thoracic aortic aneurysm. Echo 11/21 showed normal LV function; mild LVE, mild MR, mild AS with mean gradient 10 mmHg. Since last seen he has some dyspnea on exertion but no orthopnea or PND.  Occasional minimal pedal edema resolved with taking Lasix as needed.  He has had occasional chest pain over the past 4 months.  It is substernal and described as a pressure.  No radiation or associated symptoms.  Not pleuritic or related to food.  Not exertional.  Resolves after 30 minutes spontaneously.  No syncope.  Current Outpatient Medications  Medication Sig Dispense Refill   colchicine 0.6 MG tablet Take 0.6 mg by mouth daily as needed (gout).     Cyanocobalamin (B-12) 3000 MCG CAPS Take 6,000 mcg by mouth daily.     diclofenac (VOLTAREN) 75 MG EC tablet Take 75 mg by mouth daily.     empagliflozin (JARDIANCE) 10 MG TABS tablet Take 10 mg by mouth daily.     Melatonin 5 MG TABS Take 10 mg by mouth at bedtime.     metFORMIN (GLUCOPHAGE-XR) 500 MG 24 hr tablet Take 500 mg by mouth See admin instructions. Take 500 mg daily, may take a second 500 mg dose in the evening as needed for high blood sugar     triamcinolone cream (KENALOG) 0.1 % Apply 1 application topically daily.     No current facility-administered medications for this visit.     Past Medical History:  Diagnosis Date   Arthritis    Neck C3,C4, C5   GERD without esophagitis 2016   NEG CARDIAC EVALUATION [facility]    Past Surgical History:  Procedure Laterality Date   CHOLECYSTECTOMY  2006   GB FULL OF "SAND", NEEDED A DRAIN DUE TO INFECTION   COLONOSCOPY  [facility]   COLONOSCOPY N/A 10/14/2014   Procedure: COLONOSCOPY;  Surgeon: Danie Binder, MD;  [facility];  Service: Endoscopy;  Laterality: N/A;  100pm   COLONOSCOPY WITH PROPOFOL N/A 02/24/2021   Procedure: COLONOSCOPY WITH PROPOFOL;  Surgeon: Harvel Quale, MD;  [facility];  Service:  Gastroenterology;  Laterality: N/A;  8:35   ESOPHAGOGASTRODUODENOSCOPY N/A 10/14/2014   Procedure: ESOPHAGOGASTRODUODENOSCOPY (EGD);  Surgeon: Danie Binder, MD;  [facility];  Service: Endoscopy;  Laterality: N/A;   HEMORRHOID BANDING N/A 10/14/2014   Procedure: HEMORRHOID BANDING;  Surgeon: Danie Binder, MD;  [facility];  Service: Endoscopy;  Laterality: N/A;   HEMOSTASIS CLIP PLACEMENT  02/24/2021   Procedure: HEMOSTASIS CLIP PLACEMENT;  Surgeon: Harvel Quale, MD;  [facility];  Service: Gastroenterology;;   HERNIA REPAIR  [id number]   COMPLICATED BY PROLONGED ILEUS   POLYPECTOMY  02/24/2021   Procedure: POLYPECTOMY;  Surgeon: Harvel Quale, MD;  [facility];  Service: Gastroenterology;;    Social History   Socioeconomic History   Marital status: Married    Spouse name: Not on file   Number of children: Not on file   Years of education: Not on file   Highest education level: Not on file  Occupational History   Not on file  Tobacco Use   Smoking status: Never   Smokeless tobacco: Never  Vaping Use   Vaping Use: Never used  Substance and Sexual Activity   Alcohol use: Never    Alcohol/week: 0.0 standard drinks   Drug use: Never   Sexual activity: Not on  file  Other Topics Concern   Not on file  Social History Narrative   Part owner OF [employer]   Social Determinants of Health   Financial Resource Strain: Not on file  Food Insecurity: Not on file  Transportation Needs: Not on file  Physical Activity: Not on file  Stress: Not on file  Social Connections: Not on file  Intimate Partner Violence: Not on file    Family History  Problem Relation Age of Onset   Colon polyps Mother    Kidney disease Mother    Colon cancer Maternal Grandmother     ROS: no fevers or chills, productive cough, hemoptysis, dysphasia, odynophagia, melena, hematochezia, dysuria, hematuria, rash, seizure activity,  orthopnea, PND, pedal edema, claudication. Remaining systems are negative.  Physical Exam: Well-developed well-nourished in no acute distress.  Skin is warm and dry.  HEENT is normal.  Neck is supple.  Chest is clear to auscultation with normal expansion.  Cardiovascular exam is regular rate and rhythm.  Abdominal exam nontender or distended. No masses palpated. Extremities show no edema. neuro grossly intact  ECG-normal sinus rhythm at a rate of 69, nonspecific ST changes, right axis deviation.  Personally reviewed  A/P  1 bicuspid aortic valve-patient remains asymptomatic.  Repeat echo to reassess for aortic stenosis/aortic insufficiency..   2 chest pain-symptoms with both typical and atypical features.  We will arrange cardiac CTA to rule out obstructive coronary disease.  This will also help reassess aortic root.   3 OSA-per pulmonary.  4 hypertension-patient's blood pressure is elevated and his systolic is in the 332 range at home by his report.  Begin Avapro 150 mg daily.  In 1 week check potassium and renal function.  5 obesity-needs continued efforts at weight loss.   Kirk Ruths, MD

## 2021-06-19 ENCOUNTER — Encounter: Payer: Self-pay | Admitting: Cardiology

## 2021-06-19 ENCOUNTER — Ambulatory Visit: Payer: BC Managed Care – PPO | Admitting: Cardiology

## 2021-06-19 ENCOUNTER — Other Ambulatory Visit: Payer: Self-pay

## 2021-06-19 VITALS — BP 160/78 | HR 69 | Ht 71.0 in | Wt 296.4 lb

## 2021-06-19 DIAGNOSIS — Q231 Congenital insufficiency of aortic valve: Secondary | ICD-10-CM | POA: Diagnosis not present

## 2021-06-19 DIAGNOSIS — R072 Precordial pain: Secondary | ICD-10-CM

## 2021-06-19 DIAGNOSIS — Q2381 Bicuspid aortic valve: Secondary | ICD-10-CM

## 2021-06-19 DIAGNOSIS — I10 Essential (primary) hypertension: Secondary | ICD-10-CM | POA: Diagnosis not present

## 2021-06-19 MED ORDER — IRBESARTAN 150 MG PO TABS: 150.0000 mg | ORAL_TABLET | Freq: Every day | ORAL | 3 refills | Status: DC

## 2021-06-19 MED ORDER — METOPROLOL TARTRATE 100 MG PO TABS: ORAL_TABLET | ORAL | 0 refills | Status: DC

## 2021-06-19 NOTE — Patient Instructions (Signed)
Medication Instructions:   START IRBESARTAN 150 MG ONCE DAILY  *If you need a refill on your cardiac medications before your next appointment, please call your pharmacy*   Lab Work:  Your physician recommends that you return for lab work in: [place]  If you have labs (blood work) drawn today and your tests are completely normal, you will receive your results only by: [employer] (if you have MyChart) OR A paper copy in the mail If you have any lab test that is abnormal or we need to change your treatment, we will call you to review the results.   Testing/Procedures:  Your physician has requested that you have an echocardiogram. Echocardiography is a painless test that uses sound waves to create images of your heart. It provides your doctor with information about the size and shape of your heart and how well your heart's chambers and valves are working. This procedure takes approximately one hour. There are no restrictions for this procedure. [place]    Your cardiac CT will be scheduled at    [facility] [place], [place] [address] [phone]  If scheduled at [facility], please arrive at the [facility] main entrance (entrance A) of [facility] 30 minutes prior to test start time. You can use the FREE valet parking offered at the main entrance (encouraged to control the heart rate for the test) Proceed to the [facility] Radiology Department (first floor) to check-in and test prep.   Please follow these instructions carefully (unless otherwise directed):  Hold all erectile dysfunction medications at least 3 days (72 hrs) prior to test.  On the Night Before the Test: Be sure to Drink plenty of water. Do not consume any caffeinated/decaffeinated beverages or chocolate 12 hours prior to your test. Do not take any antihistamines 12 hours prior to your test.   On the Day of the Test: Drink plenty of water  until 1 hour prior to the test. Do not eat any food 4 hours prior to the test. You may take your regular medications prior to the test.  Take metoprolol (Lopressor) 100 MG two hours prior to test. HOLD Furosemide/Hydrochlorothiazide morning of the test. FEMALES- please wear underwire-free bra if available, avoid dresses & tight clothing  After the Test: Drink plenty of water. After receiving IV contrast, you may experience a mild flushed feeling. This is normal. On occasion, you may experience a mild rash up to 24 hours after the test. This is not dangerous. If this occurs, you can take Benadryl 25 mg and increase your fluid intake. If you experience trouble breathing, this can be serious. If it is severe call 911 IMMEDIATELY. If it is mild, please call our office. If you take any of these medications: Glipizide/Metformin, Avandament, Glucavance, please do not take 48 hours after completing test unless otherwise instructed.  Please allow 2-4 weeks for scheduling of routine cardiac CTs. Some insurance companies require a pre-authorization which may delay scheduling of this test.   For non-scheduling related questions, please contact the cardiac imaging nurse navigator should you have any questions/concerns: Marchia Bond, Cardiac Imaging Nurse Navigator Gordy Clement, Cardiac Imaging Nurse Navigator [facility] Direct Office Dial: [phone]   For scheduling needs, including cancellations and rescheduling, please call [place], [phone].    Follow-Up: At [facility], you and your health needs are our priority.  As part of our continuing mission to provide you with exceptional  heart care, we have created designated Provider Care Teams.  These Care Teams include your primary Cardiologist (physician) and Advanced Practice Providers (APPs -  Physician Assistants and Nurse Practitioners) who all work together to provide you with the care you need, when you need  it.  We recommend signing up for the patient portal called "MyChart".  Sign up information is provided on this After Visit Summary.  MyChart is used to connect with patients for Virtual Visits (Telemedicine).  Patients are able to view lab/test results, encounter notes, upcoming appointments, etc.  Non-urgent messages can be sent to your provider as well.   To learn more about what you can do with MyChart, go to [email or web address].    Your next appointment:   12 month(s)  The format for your next appointment:   In Person  Provider:   Kirk Ruths, MD

## 2021-06-24 ENCOUNTER — Telehealth: Payer: Self-pay | Admitting: Cardiology

## 2021-06-24 NOTE — Telephone Encounter (Signed)
Pt is stating that he is needing to have an xray done prior to having CT done, pt may have stamples in him from a previous procedure and would like to make sure these are gone before continuing to prevent harm... please advise

## 2021-06-24 NOTE — Telephone Encounter (Signed)
Spoke with patient regarding his concern about the "staples being pulled out from polyps removal during a colonoscopy." Reassured patient that the cardiac CT will not affect the staples. I suggested that he call the imaging center to let them know his concerns. Patient voiced understanding of our conversation.

## 2021-06-25 ENCOUNTER — Telehealth (HOSPITAL_COMMUNITY): Payer: Self-pay | Admitting: Emergency Medicine

## 2021-06-25 NOTE — Telephone Encounter (Signed)
Reaching out to patient to offer assistance regarding upcoming cardiac imaging study; pt verbalizes understanding of appt date/time, parking situation and where to check in, pre-test NPO status and medications ordered, and verified current allergies; name and call back number provided for further questions should they arise Annella Prowell RN Navigator Cardiac Imaging [facility] [phone] office [phone] cell  100mg metoprolol tartrate Denies iv issues 

## 2021-06-26 LAB — BASIC METABOLIC PANEL
BUN/Creatinine Ratio: 12 (ref 10–24)
BUN: 15 mg/dL (ref 8–27)
CO2: 26 mmol/L (ref 20–29)
Calcium: 9.2 mg/dL (ref 8.6–10.2)
Chloride: 103 mmol/L (ref 96–106)
Creatinine, Ser: 1.3 mg/dL — ABNORMAL HIGH (ref 0.76–1.27)
Glucose: 106 mg/dL — ABNORMAL HIGH (ref 70–99)
Potassium: 4.5 mmol/L (ref 3.5–5.2)
Sodium: 142 mmol/L (ref 134–144)
eGFR: 63 mL/min/{1.73_m2} (ref >59)

## 2021-06-29 ENCOUNTER — Other Ambulatory Visit: Payer: Self-pay

## 2021-06-29 ENCOUNTER — Encounter (HOSPITAL_COMMUNITY): Payer: Self-pay

## 2021-06-29 ENCOUNTER — Ambulatory Visit (HOSPITAL_COMMUNITY)
Admission: RE | Admit: 2021-06-29 | Discharge: 2021-06-29 | Disposition: A | Discharge status: Home / Self Care | Payer: BC Managed Care – PPO | Source: Ambulatory Visit | Attending: Cardiology | Admitting: Cardiology

## 2021-06-29 DIAGNOSIS — R072 Precordial pain: Secondary | ICD-10-CM | POA: Insufficient documentation

## 2021-06-29 MED ORDER — IOHEXOL 350 MG/ML SOLN
80.0000 mL | Freq: Once | INTRAVENOUS | Status: AC | PRN
  Administered 2021-06-29: 80 mL via INTRAVENOUS

## 2021-06-29 MED ORDER — IOHEXOL 350 MG/ML SOLN
95.0000 mL | Freq: Once | INTRAVENOUS | Status: AC | PRN
  Administered 2021-06-29: 95 mL via INTRAVENOUS

## 2021-06-29 MED ORDER — NITROGLYCERIN 0.4 MG SL SUBL
SUBLINGUAL_TABLET | SUBLINGUAL | Status: AC
  Filled 2021-06-29: qty 2

## 2021-06-29 MED ORDER — NITROGLYCERIN 0.4 MG SL SUBL
0.8000 mg | SUBLINGUAL_TABLET | Freq: Once | SUBLINGUAL | Status: AC
  Administered 2021-06-29: 0.8 mg via SUBLINGUAL

## 2021-06-30 ENCOUNTER — Telehealth: Payer: Self-pay | Admitting: *Deleted

## 2021-06-30 DIAGNOSIS — I2510 Atherosclerotic heart disease of native coronary artery without angina pectoris: Secondary | ICD-10-CM

## 2021-06-30 MED ORDER — ASPIRIN EC 81 MG PO TBEC
81.0000 mg | DELAYED_RELEASE_TABLET | Freq: Every day | ORAL | 3 refills | Status: DC
Start: 2021-06-30 — End: 2023-04-11

## 2021-06-30 MED ORDER — ROSUVASTATIN CALCIUM 40 MG PO TABS: 40.0000 mg | ORAL_TABLET | Freq: Every day | ORAL | 3 refills | Status: DC

## 2021-06-30 NOTE — Telephone Encounter (Signed)
-----   Message from Lelon Perla, MD sent at 06/29/2021 12:39 PM EST ----- Mild CAD; needs ASA 81 mg daily and crestor 40 mg daily; lipids and liver 8 weeks Kirk Ruths

## 2021-06-30 NOTE — Telephone Encounter (Signed)
pt aware of results  New script sent to the pharmacy  Lab orders mailed to the pt  

## 2021-07-14 ENCOUNTER — Other Ambulatory Visit (HOSPITAL_COMMUNITY): Payer: BC Managed Care – PPO

## 2021-07-30 ENCOUNTER — Other Ambulatory Visit: Payer: Self-pay

## 2021-07-30 ENCOUNTER — Ambulatory Visit (HOSPITAL_COMMUNITY): Payer: BC Managed Care – PPO | Attending: Cardiology

## 2021-07-30 DIAGNOSIS — Q231 Congenital insufficiency of aortic valve: Secondary | ICD-10-CM | POA: Diagnosis present

## 2021-07-30 LAB — ECHOCARDIOGRAM COMPLETE
AR max vel: 1.7 cm2
AV Area VTI: 1.81 cm2
AV Area mean vel: 1.76 cm2
AV Mean grad: 11.5 mmHg
AV Peak grad: 21.4 mmHg
Ao pk vel: 2.32 m/s
Area-P 1/2: 4.06 cm2
S' Lateral: 3.5 cm

## 2021-09-01 ENCOUNTER — Telehealth: Payer: Self-pay | Admitting: Cardiology

## 2021-09-01 NOTE — Telephone Encounter (Signed)
Patient states he is returning a call. However, he is unsure of who called and what it may have been regarding. Please advise.

## 2021-09-01 NOTE — Telephone Encounter (Signed)
Spoke to patient he stated he noticed our office called his phone number and did not leave message.Advised unable to know who called no documentation.Stated he is doing ok he would like to schedule appointment with Dr.Crenshaw to discuss taking crestor.Stated he never started taking.He wants to discuss.Appointment scheduled with Dr.Crenshaw 09/22/21 at 8:40 am.

## 2021-09-09 NOTE — Progress Notes (Deleted)
HPI:FU bicuspid aortic valve. Last echocardiogram December 2022 showed normal LV function, mild to moderate left atrial enlargement, mild mitral regurgitation, bicuspid aortic valve with mild aortic stenosis (mean gradient 11 mmHg).  Coronary CTA November 2022 showed calcium score 49 and nonobstructive coronary disease.  The aortic valve was bicuspid and the aorta was normal size.  Since last seen   Current Outpatient Medications  Medication Sig Dispense Refill   aspirin EC 81 MG tablet Take 1 tablet (81 mg total) by mouth daily. Swallow whole. 90 tablet 3   colchicine 0.6 MG tablet Take 0.6 mg by mouth daily as needed (gout).     Cyanocobalamin (B-12) 3000 MCG CAPS Take 6,000 mcg by mouth daily.     diclofenac (VOLTAREN) 75 MG EC tablet Take 75 mg by mouth daily.     empagliflozin (JARDIANCE) 10 MG TABS tablet Take 10 mg by mouth daily.     irbesartan (AVAPRO) 150 MG tablet Take 1 tablet (150 mg total) by mouth daily. 90 tablet 3   Melatonin 5 MG TABS Take 10 mg by mouth at bedtime.     metFORMIN (GLUCOPHAGE-XR) 500 MG 24 hr tablet Take 500 mg by mouth See admin instructions. Take 500 mg daily, may take a second 500 mg dose in the evening as needed for high blood sugar     metoprolol tartrate (LOPRESSOR) 100 MG tablet TAKE 2 HOURS PRIOR TO CT SCAN 1 tablet 0   triamcinolone cream (KENALOG) 0.1 % Apply 1 application topically daily.     No current facility-administered medications for this visit.     Past Medical History:  Diagnosis Date   Arthritis    Neck C3,C4, C5   GERD without esophagitis 2016   NEG CARDIAC EVALUATION [facility]    Past Surgical History:  Procedure Laterality Date   CHOLECYSTECTOMY  2006   GB FULL OF "SAND", NEEDED A DRAIN DUE TO INFECTION   COLONOSCOPY  [facility]   COLONOSCOPY N/A 10/14/2014   Procedure: COLONOSCOPY;  Surgeon: Danie Binder, MD;  [facility];  Service: Endoscopy;  Laterality: N/A;  100pm   COLONOSCOPY WITH PROPOFOL N/A 02/24/2021    Procedure: COLONOSCOPY WITH PROPOFOL;  Surgeon: Harvel Quale, MD;  [facility];  Service: Gastroenterology;  Laterality: N/A;  8:35   ESOPHAGOGASTRODUODENOSCOPY N/A 10/14/2014   Procedure: ESOPHAGOGASTRODUODENOSCOPY (EGD);  Surgeon: Danie Binder, MD;  [facility];  Service: Endoscopy;  Laterality: N/A;   HEMORRHOID BANDING N/A 10/14/2014   Procedure: HEMORRHOID BANDING;  Surgeon: Danie Binder, MD;  [facility];  Service: Endoscopy;  Laterality: N/A;   HEMOSTASIS CLIP PLACEMENT  02/24/2021   Procedure: HEMOSTASIS CLIP PLACEMENT;  Surgeon: Harvel Quale, MD;  [facility];  Service: Gastroenterology;;   HERNIA REPAIR  [id number]   COMPLICATED BY PROLONGED ILEUS   POLYPECTOMY  02/24/2021   Procedure: POLYPECTOMY;  Surgeon: Harvel Quale, MD;  [facility];  Service: Gastroenterology;;    Social History   Socioeconomic History   Marital status: Married    Spouse name: Not on file   Number of children: Not on file   Years of education: Not on file   Highest education level: Not on file  Occupational History   Not on file  Tobacco Use   Smoking status: Never   Smokeless tobacco: Never  Vaping Use   Vaping Use: Never used  Substance and Sexual Activity   Alcohol use:  Never    Alcohol/week: 0.0 standard drinks   Drug use: Never   Sexual activity: Not on file  Other Topics Concern   Not on file  Social History Narrative   Part owner OF [employer]   Social Determinants of Health   Financial Resource Strain: Not on file  Food Insecurity: Not on file  Transportation Needs: Not on file  Physical Activity: Not on file  Stress: Not on file  Social Connections: Not on file  Intimate Partner Violence: Not on file    Family History  Problem Relation Age of Onset   Colon polyps Mother    Kidney disease Mother    Colon cancer Maternal Grandmother     ROS: no fevers or chills, productive  cough, hemoptysis, dysphasia, odynophagia, melena, hematochezia, dysuria, hematuria, rash, seizure activity, orthopnea, PND, pedal edema, claudication. Remaining systems are negative.  Physical Exam: Well-developed well-nourished in no acute distress.  Skin is warm and dry.  HEENT is normal.  Neck is supple.  Chest is clear to auscultation with normal expansion.  Cardiovascular exam is regular rate and rhythm.  Abdominal exam nontender or distended. No masses palpated. Extremities show no edema. neuro grossly intact  ECG- personally reviewed  A/P  1 bicuspid aortic valve-mild aortic stenosis noted on recent echocardiogram.  Plan follow-up study December 2023.  Note CTA showed no associated thoracic aortic aneurysm.  2 coronary artery disease-plan to continue medical therapy with aspirin and statin.  3 obstructive sleep apnea-managed by pulmonary.  4 hypertension-blood pressure controlled.  Continue present medications.  5 obesity-we discussed importance of weight loss.  6 hyperlipidemia-  Kirk Ruths, MD

## 2021-09-22 ENCOUNTER — Ambulatory Visit: Payer: BC Managed Care – PPO | Admitting: Cardiology

## 2021-12-29 NOTE — Progress Notes (Signed)
HPI: FU bicuspid aortic valve. Coronary CTA November 2022 showed calcium score 49 which was 54th percentile, mild plaque in the first diagonal and no other obstructive disease noted.  The aortic valve is bicuspid with aortic valve calcium score of 155; aorta normal size.  Echocardiogram December 2022 showed normal LV function, mild to moderate left atrial enlargement, mild mitral regurgitation, bicuspid aortic valve with mild aortic stenosis (mean gradient 11.5 mmHg).  Since last seen he has dyspnea with more vigorous activities but not routine activities.  No orthopnea or PND.  Mild pedal edema.  He denies exertional chest pain or syncope.  Current Outpatient Medications  Medication Sig Dispense Refill   aspirin EC 81 MG tablet Take 1 tablet (81 mg total) by mouth daily. Swallow whole. 90 tablet 3   Cholecalciferol (VITAMIN D3 PO) Take 1 capsule by mouth daily.     colchicine 0.6 MG tablet Take 0.6 mg by mouth daily as needed (gout).     diclofenac (VOLTAREN) 75 MG EC tablet Take 75 mg by mouth daily.     empagliflozin (JARDIANCE) 10 MG TABS tablet Take 10 mg by mouth daily.     Melatonin 5 MG TABS Take 10 mg by mouth at bedtime.     metFORMIN (GLUCOPHAGE-XR) 500 MG 24 hr tablet Take 500 mg by mouth See admin instructions. Take 500 mg daily, may take a second 500 mg dose in the evening as needed for high blood sugar     triamcinolone cream (KENALOG) 0.1 % Apply 1 application topically daily.     No current facility-administered medications for this visit.     Past Medical History:  Diagnosis Date   Arthritis    Neck C3,C4, C5   GERD without esophagitis 2016   [facility]G CARDIAC EVALUATION [facility]    Past Surgical History:  Procedure Laterality Date   CHOLECYSTECTOMY  2006   GB FULL OF "SAND", NEEDED A DRAIN DUE TO INFECTION   COLONOSCOPY  [facility]   COLONOSCOPY N/A 10/14/2014   Procedure: COLONOSCOPY;  Surgeon: Danie Binder, MD;  [facility];  Service: Endoscopy;  Laterality:  N/A;  100pm   COLONOSCOPY WITH PROPOFOL N/A 02/24/2021   Procedure: COLONOSCOPY WITH PROPOFOL;  Surgeon: Harvel Quale, MD;  [facility];  Service: Gastroenterology;  Laterality: N/A;  8:35   ESOPHAGOGASTRODUODENOSCOPY N/A 10/14/2014   Procedure: ESOPHAGOGASTRODUODENOSCOPY (EGD);  Surgeon: Danie Binder, MD;  [facility];  Service: Endoscopy;  Laterality: N/A;   HEMORRHOID BANDING N/A 10/14/2014   Procedure: HEMORRHOID BANDING;  Surgeon: Danie Binder, MD;  [facility];  Service: Endoscopy;  Laterality: N/A;   HEMOSTASIS CLIP PLACEMENT  02/24/2021   Procedure: HEMOSTASIS CLIP PLACEMENT;  Surgeon: Harvel Quale, MD;  [facility];  Service: Gastroenterology;;   HERNIA REPAIR  [id number]   COMPLICATED BY PROLONGED ILEUS   POLYPECTOMY  02/24/2021   Procedure: POLYPECTOMY;  Surgeon: Harvel Quale, MD;  [facility];  Service: Gastroenterology;;    Social History   Socioeconomic History   Marital status: Married    Spouse name: Not on file   Number of children: Not on file   Years of education: Not on file   Highest education level: Not on file  Occupational History   Not on file  Tobacco Use   Smoking status: Never   Smokeless tobacco: Never  Vaping Use   Vaping Use: Never used  Substance and Sexual  Activity   Alcohol use: Never    Alcohol/week: 0.0 standard drinks   Drug use: Never   Sexual activity: Not on file  Other Topics Concern   Not on file  Social History Narrative   Part owner OF [employer]   Social Determinants of Health   Financial Resource Strain: Not on file  Food Insecurity: Not on file  Transportation Needs: Not on file  Physical Activity: Not on file  Stress: Not on file  Social Connections: Not on file  Intimate Partner Violence: Not on file    Family History  Problem Relation Age of Onset   Colon polyps Mother    Kidney disease Mother    Colon cancer Maternal  Grandmother     ROS: no fevers or chills, productive cough, hemoptysis, dysphasia, odynophagia, melena, hematochezia, dysuria, hematuria, rash, seizure activity, orthopnea, PND, claudication. Remaining systems are negative.  Physical Exam: Well-developed obese in no acute distress.  Skin is warm and dry.  HEENT is normal.  Neck is supple.  Chest is clear to auscultation with normal expansion.  Cardiovascular exam is regular rate and rhythm.  Abdominal exam nontender or distended. No masses palpated. Extremities show trace to 1+ edema. neuro grossly intact  ECG-normal sinus rhythm at a rate of 67, right axis deviation, nonspecific ST changes.  Personally reviewed  A/P  1 bicuspid aortic valve/aortic stenosis-patient doing well from a symptomatic standpoint.  Most recent echocardiogram showed mild aortic stenosis.  We will plan follow-up study December 2023.  He understands he will likely require aortic valve replacement in the future.  2 coronary artery disease-mild on previous coronary CTA.  Continue aspirin 81 mg daily and statin.  3 hypertension-patient's blood pressure is elevated.  Add Avapro 150 mg daily.  In 1 week check potassium and renal function.  Follow blood pressure and adjust regimen as needed.  4 obstructive sleep apnea-managed by pulmonary.  5 obesity-needs continued efforts at weight loss.  6 hyperlipidemia-continue Crestor.  Check lipids and liver.  Kirk Ruths, MD

## 2022-01-12 ENCOUNTER — Ambulatory Visit: Payer: BC Managed Care – PPO | Admitting: Cardiology

## 2022-01-12 ENCOUNTER — Encounter: Payer: Self-pay | Admitting: Cardiology

## 2022-01-12 VITALS — BP 148/86 | HR 67 | Ht 71.0 in | Wt 298.0 lb

## 2022-01-12 DIAGNOSIS — E7800 Pure hypercholesterolemia, unspecified: Secondary | ICD-10-CM

## 2022-01-12 DIAGNOSIS — Q231 Congenital insufficiency of aortic valve: Secondary | ICD-10-CM | POA: Diagnosis not present

## 2022-01-12 DIAGNOSIS — I10 Essential (primary) hypertension: Secondary | ICD-10-CM

## 2022-01-12 DIAGNOSIS — I2510 Atherosclerotic heart disease of native coronary artery without angina pectoris: Secondary | ICD-10-CM

## 2022-01-12 MED ORDER — IRBESARTAN 150 MG PO TABS: 150.0000 mg | ORAL_TABLET | Freq: Every day | ORAL | 3 refills | Status: AC

## 2022-01-12 MED ORDER — ROSUVASTATIN CALCIUM 40 MG PO TABS: 40.0000 mg | ORAL_TABLET | Freq: Every day | ORAL | 3 refills | Status: DC

## 2022-01-12 NOTE — Patient Instructions (Signed)
Medication Instructions:   CONTINUE ROSUVASTATIN 40 MG ONCE DAILY  START IRBESARTAN 150 MG ONCE DAILY  *If you need a refill on your cardiac medications before your next appointment, please call your pharmacy*   Lab Work:  Your physician recommends that you return for lab work in: [facility]  If you have labs (blood work) drawn today and your tests are completely normal, you will receive your results only by: [place] (if you have MyChart) OR A paper copy in the mail If you have any lab test that is abnormal or we need to change your treatment, we will call you to review the results.   Follow-Up: At [facility], you and your health needs are our priority.  As part of our continuing mission to provide you with exceptional heart care, we have created designated Provider Care Teams.  These Care Teams include your primary Cardiologist (physician) and Advanced Practice Providers (APPs -  Physician Assistants and Nurse Practitioners) who all work together to provide you with the care you need, when you need it.  We recommend signing up for the patient portal called "MyChart".  Sign up information is provided on this After Visit Summary.  MyChart is used to connect with patients for Virtual Visits (Telemedicine).  Patients are able to view lab/test results, encounter notes, upcoming appointments, etc.  Non-urgent messages can be sent to your provider as well.   To learn more about what you can do with MyChart, go to [email or web address].    Your next appointment:   12 month(s)  The format for your next appointment:   In Person  Provider:   Kirk Ruths MD  1}    Important Information About Sugar

## 2022-01-30 LAB — COMPREHENSIVE METABOLIC PANEL
ALT: 92 IU/L — ABNORMAL HIGH (ref 0–44)
AST: 108 IU/L — ABNORMAL HIGH (ref 0–40)
Albumin/Globulin Ratio: 1.8 (ref 1.2–2.2)
Albumin: 4.6 g/dL (ref 3.8–4.8)
Alkaline Phosphatase: 174 IU/L — ABNORMAL HIGH (ref 44–121)
BUN/Creatinine Ratio: 12 (ref 10–24)
BUN: 14 mg/dL (ref 8–27)
Bilirubin Total: 0.6 mg/dL (ref 0.0–1.2)
CO2: 26 mmol/L (ref 20–29)
Calcium: 9.9 mg/dL (ref 8.6–10.2)
Chloride: 100 mmol/L (ref 96–106)
Creatinine, Ser: 1.14 mg/dL (ref 0.76–1.27)
Globulin, Total: 2.5 g/dL (ref 1.5–4.5)
Glucose: 113 mg/dL — ABNORMAL HIGH (ref 70–99)
Potassium: 4.8 mmol/L (ref 3.5–5.2)
Sodium: 141 mmol/L (ref 134–144)
Total Protein: 7.1 g/dL (ref 6.0–8.5)
eGFR: 73 mL/min/{1.73_m2} (ref >59)

## 2022-01-30 LAB — LIPID PANEL
Chol/HDL Ratio: 2.6 ratio (ref 0.0–5.0)
Cholesterol, Total: 156 mg/dL (ref 100–199)
HDL: 60 mg/dL (ref >39)
LDL Chol Calc (NIH): 70 mg/dL (ref 0–99)
Triglycerides: 155 mg/dL — ABNORMAL HIGH (ref 0–149)
VLDL Cholesterol Cal: 26 mg/dL (ref 5–40)

## 2022-02-01 ENCOUNTER — Other Ambulatory Visit (HOSPITAL_BASED_OUTPATIENT_CLINIC_OR_DEPARTMENT_OTHER): Payer: Self-pay | Admitting: *Deleted

## 2022-02-01 DIAGNOSIS — E7800 Pure hypercholesterolemia, unspecified: Secondary | ICD-10-CM

## 2022-03-05 LAB — HEPATIC FUNCTION PANEL
ALT: 70 IU/L — ABNORMAL HIGH (ref 0–44)
AST: 59 IU/L — ABNORMAL HIGH (ref 0–40)
Albumin: 4.1 g/dL (ref 3.9–4.9)
Alkaline Phosphatase: 145 IU/L — ABNORMAL HIGH (ref 44–121)
Bilirubin Total: 0.5 mg/dL (ref 0.0–1.2)
Bilirubin, Direct: 0.13 mg/dL (ref 0.00–0.40)
Total Protein: 6.8 g/dL (ref 6.0–8.5)

## 2022-03-10 ENCOUNTER — Other Ambulatory Visit: Payer: Self-pay | Admitting: *Deleted

## 2022-03-10 DIAGNOSIS — E7800 Pure hypercholesterolemia, unspecified: Secondary | ICD-10-CM

## 2022-03-15 ENCOUNTER — Ambulatory Visit (INDEPENDENT_AMBULATORY_CARE_PROVIDER_SITE_OTHER): Payer: BC Managed Care – PPO | Admitting: Pharmacist Clinician (PhC)/ Clinical Pharmacy Specialist

## 2022-03-15 ENCOUNTER — Encounter: Payer: Self-pay | Admitting: Pharmacist Clinician (PhC)/ Clinical Pharmacy Specialist

## 2022-03-15 DIAGNOSIS — Z1389 Encounter for screening for other disorder: Secondary | ICD-10-CM | POA: Diagnosis not present

## 2022-03-15 DIAGNOSIS — E785 Hyperlipidemia, unspecified: Secondary | ICD-10-CM | POA: Insufficient documentation

## 2022-03-15 DIAGNOSIS — E7849 Other hyperlipidemia: Secondary | ICD-10-CM

## 2022-03-15 DIAGNOSIS — E7800 Pure hypercholesterolemia, unspecified: Secondary | ICD-10-CM

## 2022-03-15 MED ORDER — NEXLIZET 180-10 MG PO TABS: 1.0000 | ORAL_TABLET | Freq: Every day | ORAL | 0 refills | Status: DC

## 2022-03-15 NOTE — Progress Notes (Signed)
03/15/2022 Bryan Austin [birth year] [id number]   HPI:  Bryan Austin is a 61 y.o. male patient of Dr Stanford Breed, who presents today for a lipid clinic evaluation.  See pertinent past medical history below.  Bryan Austin was started on rosuvastatin earlier this year when a coronary calcium screen showed him to be in the 54th percentile for matched controls.  He tolerated the rosuvastatin, but was later learned that it was causing and elevation in LFT and medication had to be discontinued.  Repeat labs after discontinuation showed labs dropping, but still not back to baseline.   He is in the office today to discuss other options for cholesterol lowering.    Past Medical History: hypertension Treated with irbesartan  CAD Coronary calcium score 66 - 54th percentile  CHF Chronic diastolic  DM2 No Z0C;  on metformin, empagliflozin  obesity BMI 41.6 at last visit   Current Medications: none  Cholesterol Goals: LDL < 70   Intolerant/previously tried: rosuvastatin - elevation in LFT's  Family history: father died from CHF, DM2 (dialysis, vision impairment, lower extremity amputation); mother cancer - kidney/lung; sister doesn't see MD; son had drug issue, now clear x 2 years, has htn; another son died at 39, daughter healthy at 76  Diet: egg sandwich with lettuce/tomato for breakfast; restaurant for lunch - meat + 2 veggies, and cornbread and sweat tea; dinner mix of meats; plenty of vegetables, no snacking, occasional yogurt for dessert  Exercise:  started going to chiropractor office that has exercise program with personal trainer 30 min twice weekly  Labs:  6/23:  TC 156, TG 155, HDL 60, LDL 70 (on rosuvastatin 40)   Current Outpatient Medications  Medication Sig Dispense Refill   aspirin EC 81 MG tablet Take 1 tablet (81 mg total) by mouth daily. Swallow whole. 90 tablet 3   Bempedoic Acid-Ezetimibe (NEXLIZET) 180-10 MG TABS Take 1 tablet by mouth daily. 21 tablet 0    Cholecalciferol (VITAMIN D3 PO) Take 1 capsule by mouth daily.     Chromium-Cinnamon (CINNAMON PLUS CHROMIUM PO) Take 1 capsule by mouth daily.     colchicine 0.6 MG tablet Take 0.6 mg by mouth daily as needed (gout).     Diclofenac Potassium 25 MG CAPS Take 25 mg by mouth daily as needed.     empagliflozin (JARDIANCE) 10 MG TABS tablet Take 10 mg by mouth daily.     irbesartan (AVAPRO) 150 MG tablet Take 1 tablet (150 mg total) by mouth daily. 90 tablet 3   Melatonin 5 MG TABS Take 10 mg by mouth at bedtime.     metFORMIN (GLUCOPHAGE-XR) 500 MG 24 hr tablet Take 500 mg by mouth See admin instructions. Take 500 mg daily, may take a second 500 mg dose in the evening as needed for high blood sugar     No current facility-administered medications for this visit.    Allergies  Allergen Reactions   Adhesive [Tape] Itching and Rash    Past Medical History:  Diagnosis Date   Arthritis    Neck C3,C4, C5   GERD without esophagitis 2016   NEG CARDIAC EVALUATION [facility]    There were no vitals taken for this visit.   Hyperlipidemia Patient with hyperlipidemia, was at LDL goal on rosuvastatin 40 mg, however had to d/c secondary to rise in liver enzymes.  Reviewed options for lowering LDL cholesterol, including ezetimibe, PCSK-9 inhibitors, bempedoic acid and inclisiran.  Discussed mechanisms of action, dosing, side effects and potential decreases  in LDL cholesterol.  Also reviewed cost information and potential options for patient assistance.  Answered all patient questions.  Based on this information, patient would prefer to start bempedoic acid.  Because he has a history of gout and elevated liver enzymes, will have him start with Nexlizet and repeat labs in 2-3 weeks.  He is aware of increased risk of gout.  He was given samples x 3 weeks and will get blood drawn about the time he starts the 3rd box.  If all goes well with labs, will submit PA request.     Tommy Medal PharmD CPP [place] [address] [place], [place] [address] [phone]

## 2022-03-15 NOTE — Assessment & Plan Note (Signed)
Patient with hyperlipidemia, was at LDL goal on rosuvastatin 40 mg, however had to d/c secondary to rise in liver enzymes.  Reviewed options for lowering LDL cholesterol, including ezetimibe, PCSK-9 inhibitors, bempedoic acid and inclisiran.  Discussed mechanisms of action, dosing, side effects and potential decreases in LDL cholesterol.  Also reviewed cost information and potential options for patient assistance.  Answered all patient questions.  Based on this information, patient would prefer to start bempedoic acid.  Because he has a history of gout and elevated liver enzymes, will have him start with Nexlizet and repeat labs in 2-3 weeks.  He is aware of increased risk of gout.  He was given samples x 3 weeks and will get blood drawn about the time he starts the 3rd box.  If all goes well with labs, will submit PA request.

## 2022-03-15 NOTE — Patient Instructions (Addendum)
Your Results:             Your most recent labs Goal  Total Cholesterol 156 < 200  Triglycerides 155 < 150  HDL (happy/good cholesterol) 60 > 40  LDL (lousy/bad cholesterol 70 < 70   Medication changes:  We will start the process to get Nexlizet covered by your insurance.  Once the prior authorization is complete, we will call you to let you know and confirm pharmacy information.   You will take 1 tablet daily.     Lab orders:  We want to repeat labs in 2-3 weeks.  (Cholesterol, liver function, uric acid)    Thank you for choosing [facility]

## 2022-04-09 ENCOUNTER — Encounter (HOSPITAL_COMMUNITY): Payer: Self-pay

## 2022-04-09 ENCOUNTER — Other Ambulatory Visit: Payer: Self-pay

## 2022-04-09 DIAGNOSIS — Z7982 Long term (current) use of aspirin: Secondary | ICD-10-CM | POA: Diagnosis not present

## 2022-04-09 DIAGNOSIS — K219 Gastro-esophageal reflux disease without esophagitis: Secondary | ICD-10-CM | POA: Insufficient documentation

## 2022-04-09 DIAGNOSIS — I10 Essential (primary) hypertension: Secondary | ICD-10-CM | POA: Insufficient documentation

## 2022-04-09 DIAGNOSIS — T50905A Adverse effect of unspecified drugs, medicaments and biological substances, initial encounter: Secondary | ICD-10-CM | POA: Diagnosis not present

## 2022-04-09 DIAGNOSIS — Z79899 Other long term (current) drug therapy: Secondary | ICD-10-CM | POA: Diagnosis not present

## 2022-04-09 DIAGNOSIS — T887XXA Unspecified adverse effect of drug or medicament, initial encounter: Secondary | ICD-10-CM | POA: Insufficient documentation

## 2022-04-09 DIAGNOSIS — R079 Chest pain, unspecified: Secondary | ICD-10-CM | POA: Diagnosis present

## 2022-04-09 NOTE — ED Triage Notes (Signed)
Pt arrived via POV c/o of dysphagia, following multiple yellow jacket stings yesterday. Pt reports being evaluated at [facility] yesterday and had skin marked on arm to illustrate redness and swelling from yellow jacket stings. Pt prescribed Clindamycin by UC. Redness has decreased, but Pt reports difficulty swallowing today after starting the medication. Pt ambulatory, talking normal in Triage. Pts O2 Sats 100% RA. Respirations normal and unlabored. Pt able to control saliva w/o complication.

## 2022-04-10 ENCOUNTER — Emergency Department (HOSPITAL_COMMUNITY)
Admission: EM | Admit: 2022-04-10 | Discharge: 2022-04-10 | Disposition: A | Discharge status: Home / Self Care | Payer: BC Managed Care – PPO | Attending: Emergency Medicine | Admitting: Emergency Medicine

## 2022-04-10 DIAGNOSIS — T887XXA Unspecified adverse effect of drug or medicament, initial encounter: Secondary | ICD-10-CM

## 2022-04-10 DIAGNOSIS — R0789 Other chest pain: Secondary | ICD-10-CM

## 2022-04-10 DIAGNOSIS — K219 Gastro-esophageal reflux disease without esophagitis: Secondary | ICD-10-CM

## 2022-04-10 DIAGNOSIS — I10 Essential (primary) hypertension: Secondary | ICD-10-CM

## 2022-04-10 HISTORY — DX: Type 2 diabetes mellitus without complications: E11.9

## 2022-04-10 HISTORY — DX: Essential (primary) hypertension: I10

## 2022-04-10 HISTORY — DX: Gout, unspecified: M10.9

## 2022-04-10 LAB — TROPONIN I (HIGH SENSITIVITY): Troponin I (High Sensitivity): 3 ng/L (ref <18)

## 2022-04-10 MED ORDER — FAMOTIDINE 20 MG PO TABS
20.0000 mg | ORAL_TABLET | Freq: Once | ORAL | Status: AC
Start: 2022-04-10 — End: 2022-04-10
  Administered 2022-04-10: 20 mg via ORAL
  Filled 2022-04-10: qty 1

## 2022-04-10 MED ORDER — ALUM & MAG HYDROXIDE-SIMETH 200-200-20 MG/5ML PO SUSP
30.0000 mL | Freq: Once | ORAL | Status: AC
  Administered 2022-04-10: 30 mL via ORAL
  Filled 2022-04-10: qty 30

## 2022-04-10 MED ORDER — PANTOPRAZOLE SODIUM 40 MG PO TBEC: 40.0000 mg | DELAYED_RELEASE_TABLET | Freq: Every day | ORAL | 0 refills | Status: DC

## 2022-04-10 NOTE — ED Provider Notes (Signed)
[facility] EMERGENCY DEPARTMENT Provider Note   CSN: [id number] Arrival date & time: 04/09/22  2239     History  Chief Complaint  Patient presents with   Dysphagia    Bryan Austin is a 61 y.o. male.  Patient indicates 3 days ago, stung by yellow jackets to right upper arm, the next day, there was surrounding erythema and warmth, and pt was seen at [facility] and given rx clindamycin. Since then, the erythema/warmth is markedly improved, but w taking the new med pt notes burning discomfort sensation in mid chest/midline esp post eating, at rest. No exertional chest pain or discomfort. No associated sob, nv or diaphoresis. States med went down without problem, no fb sensation. No throat pain or swelling. No sob. No trouble swallowing. No rash/hives. No itching. Denies hx cad. ?hx gerd.   The history is provided by the patient and medical records.       Home Medications Prior to Admission medications   Medication Sig Start Date End Date Taking? Authorizing Provider  Bempedoic Acid-Ezetimibe (NEXLIZET) 180-10 MG TABS Take 1 tablet by mouth daily. 03/15/22  Yes Lelon Perla, MD  Cholecalciferol (VITAMIN D3 PO) Take 1 capsule by mouth daily.   Yes [provider]  empagliflozin (JARDIANCE) 10 MG TABS tablet Take 10 mg by mouth daily.   Yes [provider]  furosemide (LASIX) 40 MG tablet Take by mouth. 08/09/20  Yes [provider]  metFORMIN (GLUCOPHAGE-XR) 500 MG 24 hr tablet Take 500 mg by mouth See admin instructions. Take 500 mg daily, may take a second 500 mg dose in the evening as needed for high blood sugar   Yes [provider]  aspirin EC 81 MG tablet Take 1 tablet (81 mg total) by mouth daily. Swallow whole. 06/30/21   Lelon Perla, MD  Chromium-Cinnamon (CINNAMON PLUS CHROMIUM PO) Take 1 capsule by mouth daily.    [provider]  colchicine 0.6 MG tablet Take 0.6 mg by mouth daily as needed (gout).    [provider]   diclofenac (VOLTAREN) 25 MG EC tablet Take 25 mg by mouth 2 (two) times daily as needed. 01/21/22   [provider]  Diclofenac Potassium 25 MG CAPS Take 25 mg by mouth daily as needed.    [provider]  empagliflozin (JARDIANCE) 10 MG TABS tablet Take 10 mg by mouth daily.    [provider]  irbesartan (AVAPRO) 150 MG tablet Take 1 tablet (150 mg total) by mouth daily. 01/12/22   Lelon Perla, MD  Melatonin 5 MG TABS Take 10 mg by mouth at bedtime.    [provider]      Allergies    Adhesive [tape] and Bee venom    Review of Systems   Review of Systems  Constitutional:  Negative for fever.  HENT:  Negative for sore throat and trouble swallowing.   Eyes:  Negative for redness.  Respiratory:  Negative for cough and shortness of breath.   Cardiovascular:  Negative for palpitations and leg swelling.  Gastrointestinal:  Negative for abdominal pain, nausea and vomiting.  Genitourinary:  Negative for flank pain.  Musculoskeletal:  Negative for back pain and neck pain.  Skin:  Negative for rash.  Neurological:  Negative for headaches.  Hematological:  Does not bruise/bleed easily.  Psychiatric/Behavioral:  Negative for confusion.     Physical Exam Updated Vital Signs BP (!) 149/71 (BP Location: Left Arm)   Pulse 81   Temp 98.2  F (36.8 C) (Oral)   Resp 16   Ht 1.803 m ('5\' 11"'$ )   Wt 136.1 kg   SpO2 100%   BMI 41.84 kg/m  Physical Exam Vitals and nursing note reviewed.  Constitutional:      Appearance: Normal appearance. He is well-developed.  HENT:     Head: Atraumatic.     Nose: Nose normal.     Mouth/Throat:     Mouth: Mucous membranes are moist.     Pharynx: Oropharynx is clear.  Eyes:     General: No scleral icterus.    Conjunctiva/sclera: Conjunctivae normal.  Neck:     Trachea: No tracheal deviation.  Cardiovascular:     Rate and Rhythm: Normal rate and regular rhythm.     Pulses: Normal pulses.     Heart sounds:  Normal heart sounds. No murmur heard.    No friction rub. No gallop.  Pulmonary:     Effort: Pulmonary effort is normal. No accessory muscle usage or respiratory distress.     Breath sounds: Normal breath sounds.  Abdominal:     General: There is no distension.     Tenderness: There is no abdominal tenderness. There is no guarding.  Genitourinary:    Comments: No cva tenderness. Musculoskeletal:        General: No swelling.     Cervical back: Normal range of motion and neck supple. No rigidity.     Right lower leg: No edema.     Left lower leg: No edema.     Comments: Sting sites to right upper arm resolving. No fb seen or felt. No cellulitis.   Skin:    General: Skin is warm and dry.     Findings: No rash.  Neurological:     Mental Status: He is alert.     Comments: Alert, speech clear.   Psychiatric:        Mood and Affect: Mood normal.     ED Results / Procedures / Treatments   Labs (all labs ordered are listed, but only abnormal results are displayed) Results for orders placed or performed during the hospital encounter of 04/10/22  Troponin I (High Sensitivity)  Result Value Ref Range   Troponin I (High Sensitivity) 3 <18 ng/L     EKG None  Radiology No results found.  Procedures Procedures    Medications Ordered in ED Medications  famotidine (PEPCID) tablet 20 mg (20 mg Oral Given 04/10/22 0144)  alum & mag hydroxide-simeth (MAALOX/MYLANTA) 200-200-20 MG/5ML suspension 30 mL (30 mLs Oral Given 04/10/22 0144)    ED Course/ Medical Decision Making/ A&P                           Medical Decision Making Problems Addressed: Atypical chest pain: acute illness or injury with systemic symptoms that poses a threat to life or bodily functions Essential hypertension: chronic illness or injury that poses a threat to life or bodily functions Gastroesophageal reflux disease, unspecified whether esophagitis present: acute illness or injury with systemic  symptoms Medication side effect: acute illness or injury  Amount and/or Complexity of Data Reviewed External Data Reviewed: notes. Labs: ordered. Decision-making details documented in ED Course. ECG/medicine tests: ordered and independent interpretation performed. Decision-making details documented in ED Course.  Risk OTC drugs. Prescription drug management. Decision regarding hospitalization.   Iv ns. Continuous pulse ox and cardiac monitoring. Labs ordered/sent.   Diff dx includes gerd, esophagitis, acs, etc - dispo decision  including potential need for admission considered if trop elev - will get labs and reassess.   Reviewed nursing notes and prior charts for additional history. External reports reviewed.   Cardiac monitor: sinus rhythm, rate  Pepcid and maalox po.   Labs reviewed/interpreted by me - trop normal. After symptoms for past two days, felt not c/w acs. Symptoms felt most c/w gerd.  No current chest pain or discomfort of any sort. No sob or unusual doe.   Pt appears stable for d/c.  Rec pcp/card f/u.  Return precautions provided.            Final Clinical Impression(s) / ED Diagnoses Final diagnoses:  None    Rx / DC Orders ED Discharge Orders     None         Lajean Saver, MD 04/10/22 [phone]

## 2022-04-10 NOTE — Discharge Instructions (Signed)
It was our pleasure to provide your ER care today - we hope that you feel better.  If gi/reflux symptoms, take protonnix as prescribed, you may also try pepcid complete or maalox as need for symptom relief.   Follow up closely with your primary care doctor/cardiologist in the next 1-2 weeks.  Return to ER right away if worse, new symptoms, recurrent or persistent chest pain, trouble breathing, persistent vomiting, or other concern.

## 2022-04-20 ENCOUNTER — Ambulatory Visit: Payer: BC Managed Care – PPO | Attending: Physician Assistant | Admitting: Physician Assistant

## 2022-04-20 ENCOUNTER — Encounter: Payer: Self-pay | Admitting: Pharmacist Clinician (PhC)/ Clinical Pharmacy Specialist

## 2022-04-20 ENCOUNTER — Encounter: Payer: Self-pay | Admitting: Physician Assistant

## 2022-04-20 VITALS — BP 130/68 | HR 82 | Ht 70.0 in | Wt 299.0 lb

## 2022-04-20 DIAGNOSIS — I2510 Atherosclerotic heart disease of native coronary artery without angina pectoris: Secondary | ICD-10-CM | POA: Diagnosis not present

## 2022-04-20 DIAGNOSIS — E7849 Other hyperlipidemia: Secondary | ICD-10-CM | POA: Diagnosis not present

## 2022-04-20 DIAGNOSIS — Q231 Congenital insufficiency of aortic valve: Secondary | ICD-10-CM | POA: Diagnosis not present

## 2022-04-20 DIAGNOSIS — I359 Nonrheumatic aortic valve disorder, unspecified: Secondary | ICD-10-CM

## 2022-04-20 DIAGNOSIS — I10 Essential (primary) hypertension: Secondary | ICD-10-CM

## 2022-04-20 DIAGNOSIS — I2584 Coronary atherosclerosis due to calcified coronary lesion: Secondary | ICD-10-CM

## 2022-04-20 NOTE — Patient Instructions (Signed)
Medication Instructions:  Continue same medications *If you need a refill on your cardiac medications before your next appointment, please call your pharmacy*   Lab Work: None ordered   Testing/Procedures: Schedule Echo in December    Follow-Up: At [facility], you and your health needs are our priority.  As part of our continuing mission to provide you with exceptional heart care, we have created designated Provider Care Teams.  These Care Teams include your primary Cardiologist (physician) and Advanced Practice Providers (APPs -  Physician Assistants and Nurse Practitioners) who all work together to provide you with the care you need, when you need it.  We recommend signing up for the patient portal called "MyChart".  Sign up information is provided on this After Visit Summary.  MyChart is used to connect with patients for Virtual Visits (Telemedicine).  Patients are able to view lab/test results, encounter notes, upcoming appointments, etc.  Non-urgent messages can be sent to your provider as well.   To learn more about what you can do with MyChart, go to [email or web address].    Your next appointment: Call in Oct to schedule March appointment with Dr.Crenshaw     The format for your next appointment: Office     Provider:  Dr.Crenshaw   Important Information About Sugar

## 2022-04-20 NOTE — Progress Notes (Unsigned)
Cardiology Office Note:    Date:  04/22/2022   ID:  Bryan Austin, DOB August 30, 1960, MRN [id number]  PCP:  Ranae Palms, [place] Providers Cardiologist:  Kirk Ruths, MD     Referring MD: Lajean Saver, MD   Chief Complaint  Patient presents with   Follow-up    History of Present Illness:    Bryan Austin is a 61 y.o. male with a hx of bicuspid aortic valve, OSA managed by pulmonology, hypertension and history of coronary artery calcification.  Coronary CTA obtained in November 2022 showed coronary calcium score 49 which placed the patient in the 54th percentile with age and sex matched control, aortic valve was Bicuspid with aortic valve calcium score of 155, normal aortic size.  Echocardiogram in December 2022 showed normal EF, mild to moderate LAE, mild MR, bicuspid aortic valve with mild aortic stenosis with mean gradient 11.5 mmHg.  Patient was last seen by Dr. Stanford Breed in May 2022 at which time he had dyspnea with more vigorous activity but not with routine activity.  Blood pressure was elevated during the office visit, I restarted was added to his medical regiment.  Unfortunately the Crestor was causing his LFT to be elevated and had to be discontinued.  He was seen by clinical pharmacist who started him on Nexlizet.  More recently, patient presented to the ED on 04/10/2022 with dysphagia after swallowing pill.  Troponin was negative. Patient was treated with maalox and Pepcid.   Patient presents today for follow-up.  He actually had burning sensation from the stomach all the way up to the throat for the entire night before he sought medical attention in the ED recently.  Symptoms did not go away for several days and would worsen every time should he swallow.  He said it started after he got stung in the right upper arm by a yellow jacket hornet, he was given antibiotic by urgent care.  After swallowing the antibiotic, he started having burning sensation in the  chest overnight.  Symptoms eventually improved after for several days and it went away since.  Since he had at least several hours of burning chest discomfort prior to the first troponin in the emergency room, his chest pain is clearly noncardiac in nature.  Since the chest burning sensation resolved, he has not had any more issues.  I highly encouraged him to start on Nexlizet at trials, however he is hesitant to do so.  He says he is waiting for repeat the liver function test in 6 weeks, and wish to discuss with Dr. Stanford Breed in the future prior to starting on Nexlizet.    Past Medical History:  Diagnosis Date   Arthritis    Neck C3,C4, C5   Diabetes mellitus without complication ([place])    GERD without esophagitis 2016   NEG CARDIAC EVALUATION [facility]   Gout    Hypertension     Past Surgical History:  Procedure Laterality Date   CHOLECYSTECTOMY  2006   GB FULL OF "SAND", NEEDED A DRAIN DUE TO INFECTION   COLONOSCOPY  [facility]   COLONOSCOPY N/A 10/14/2014   Procedure: COLONOSCOPY;  Surgeon: Danie Binder, MD;  Location: [facility] ENDO [facility];  Service: Endoscopy;  Laterality: N/A;  100pm   COLONOSCOPY WITH PROPOFOL N/A 02/24/2021   Procedure: COLONOSCOPY WITH PROPOFOL;  Surgeon: Harvel Quale, MD;  Location: [facility];  Service: Gastroenterology;  Laterality: N/A;  8:35   ESOPHAGOGASTRODUODENOSCOPY N/A 10/14/2014  Procedure: ESOPHAGOGASTRODUODENOSCOPY (EGD);  Surgeon: Danie Binder, MD;  Location: [facility];  Service: Endoscopy;  Laterality: N/A;   HEMORRHOID BANDING N/A 10/14/2014   Procedure: HEMORRHOID BANDING;  Surgeon: Danie Binder, MD;  Location: [facility];  Service: Endoscopy;  Laterality: N/A;   HEMOSTASIS CLIP PLACEMENT  02/24/2021   Procedure: HEMOSTASIS CLIP PLACEMENT;  Surgeon: Harvel Quale, MD;  Location: [facility];  Service: Gastroenterology;;   HERNIA REPAIR  [id number]   COMPLICATED BY PROLONGED ILEUS   POLYPECTOMY  02/24/2021   Procedure: POLYPECTOMY;   Surgeon: Montez Morita, Quillian Quince, MD;  Location: [facility];  Service: Gastroenterology;;    Current Medications: Current Meds  Medication Sig   aspirin EC 81 MG tablet Take 1 tablet (81 mg total) by mouth daily. Swallow whole.   Bempedoic Acid-Ezetimibe (NEXLIZET) 180-10 MG TABS Take 1 tablet by mouth daily.   Cholecalciferol (VITAMIN D3 PO) Take 1 capsule by mouth daily.   empagliflozin (JARDIANCE) 10 MG TABS tablet Take 10 mg by mouth daily.   empagliflozin (JARDIANCE) 10 MG TABS tablet Take 10 mg by mouth daily.   irbesartan (AVAPRO) 150 MG tablet Take 1 tablet (150 mg total) by mouth daily.   Melatonin 5 MG TABS Take 10 mg by mouth at bedtime.   metFORMIN (GLUCOPHAGE-XR) 500 MG 24 hr tablet Take 500 mg by mouth See admin instructions. Take 500 mg daily, may take a second 500 mg dose in the evening as needed for high blood sugar     Allergies:   Adhesive [tape] and Bee venom   Social History   Socioeconomic History   Marital status: Married    Spouse name: Not on file   Number of children: Not on file   Years of education: Not on file   Highest education level: Not on file  Occupational History   Not on file  Tobacco Use   Smoking status: Never   Smokeless tobacco: Never  Vaping Use   Vaping Use: Never used  Substance and Sexual Activity   Alcohol use: Never    Alcohol/week: 0.0 standard drinks of alcohol   Drug use: Never   Sexual activity: Yes  Other Topics Concern   Not on file  Social History Narrative   Part owner OF [employer]   Social Determinants of Health   Financial Resource Strain: Not on file  Food Insecurity: Not on file  Transportation Needs: Not on file  Physical Activity: Not on file  Stress: Not on file  Social Connections: Not on file     Family History: The patient's family history includes Cancer in his mother; Colon cancer in his maternal grandmother; Colon polyps in his mother; Diabetes in his father; Heart failure in his  father; Kidney disease in his mother.  ROS:   Please see the history of present illness.     All other systems reviewed and are negative.  EKGs/Labs/Other Studies Reviewed:    The following studies were reviewed today:  Echo 07/30/2021  1. Left ventricular ejection fraction, by estimation, is 60 to 65%. Left  ventricular ejection fraction by 3D volume is 56 %. The left ventricle has  normal function. The left ventricle has no regional wall motion  abnormalities. Left ventricular diastolic   parameters were normal. The average left ventricular global longitudinal  strain is -23.3 %. The global longitudinal strain is normal.   2. Right ventricular systolic function is normal. The right ventricular  size is normal.  3. Left atrial size was mild to moderately dilated.   4. The mitral valve is normal in structure. Mild mitral valve  regurgitation. No evidence of mitral stenosis.   5. The aortic valve is bicuspid. There is moderate calcification of the  aortic valve. There is moderate thickening of the aortic valve. Aortic  valve regurgitation is not visualized. Mild aortic valve stenosis. Aortic  valve mean gradient measures 11.5  mmHg. Aortic valve Vmax measures 2.32 m/s.   6. The inferior vena cava is normal in size with greater than 50%  respiratory variability, suggesting right atrial pressure of 3 mmHg.   Comparison(s): Prior images reviewed side by side. 07/14/20 EF 70-75%. AV  61mHg mean, 232mg peak.   EKG:  EKG is not ordered today.    Recent Labs: 01/29/2022: BUN 14; Creatinine, Ser 1.14; Potassium 4.8; Sodium 141 03/04/2022: ALT 70  Recent Lipid Panel    Component Value Date/Time   CHOL 156 01/29/2022 1306   TRIG 155 (H) 01/29/2022 1306   HDL 60 01/29/2022 1306   CHOLHDL 2.6 01/29/2022 1306   LDLCALC 70 01/29/2022 1306     Risk Assessment/Calculations:           Physical Exam:    VS:  BP 130/68 (BP Location: Left Arm, Patient Position: Sitting, Cuff Size:  Large)   Pulse 82   Ht '5\' 10"'$  (1.778 m)   Wt 299 lb (135.6 kg)   SpO2 96%   BMI 42.90 kg/m        Wt Readings from Last 3 Encounters:  04/20/22 299 lb (135.6 kg)  04/09/22 300 lb (136.1 kg)  01/12/22 298 lb (135.2 kg)     GEN:  Well nourished, well developed in no acute distress HEENT: Normal NECK: No JVD; No carotid bruits LYMPHATICS: No lymphadenopathy CARDIAC: RRR, no murmurs, rubs, gallops RESPIRATORY:  Clear to auscultation without rales, wheezing or rhonchi  ABDOMEN: Soft, non-tender, non-distended MUSCULOSKELETAL:  No edema; No deformity  SKIN: Warm and dry NEUROLOGIC:  Alert and oriented x 3 PSYCHIATRIC:  Normal affect   ASSESSMENT:    1. Bicuspid aortic valve   2. Essential hypertension   3. Coronary artery calcification   4. Other hyperlipidemia    PLAN:    In order of problems listed above:  Bicuspid aortic valve: Due for repeat echocardiogram in December.  Hypertension: Blood pressure stable  Coronary artery calcification: Denies any recent chest pain.  Patient was seen in the ED recently due to dysphagia after swallowing pill.  Symptom is clearly GI in nature.  Troponin was negative.  Hyperlipidemia: He has history of elevated LFT on statin.  Our clinical pharmacist gave him some samples of Nexlizet, however he has not started on them yet.  I urged him to start on the medication, he wished to wait until after his next liver enzyme and discuss with Dr. CrStanford Breeduring the next visit.           Medication Adjustments/Labs and Tests Ordered: Current medicines are reviewed at length with the patient today.  Concerns regarding medicines are outlined above.  Orders Placed This Encounter  Procedures   ECHOCARDIOGRAM COMPLETE   No orders of the defined types were placed in this encounter.   Patient Instructions  Medication Instructions:  Continue same medications *If you need a refill on your cardiac medications before your next appointment, please  call your pharmacy*   Lab Work: None ordered   Testing/Procedures: Schedule Echo in December    Follow-Up: At  [facility], you and your health needs are our priority.  As part of our continuing mission to provide you with exceptional heart care, we have created designated Provider Care Teams.  These Care Teams include your primary Cardiologist (physician) and Advanced Practice Providers (APPs -  Physician Assistants and Nurse Practitioners) who all work together to provide you with the care you need, when you need it.  We recommend signing up for the patient portal called "MyChart".  Sign up information is provided on this After Visit Summary.  MyChart is used to connect with patients for Virtual Visits (Telemedicine).  Patients are able to view lab/test results, encounter notes, upcoming appointments, etc.  Non-urgent messages can be sent to your provider as well.   To learn more about what you can do with MyChart, go to [email or web address].    Your next appointment: Call in Oct to schedule March appointment with Dr.Crenshaw     The format for your next appointment: Office     Provider:  Dr.Crenshaw   Important Information About Sugar         Hilbert Corrigan, [place]  04/22/2022 10:53 PM    [place]

## 2022-05-05 IMAGING — CT CT HEART MORP W/ CTA COR W/ SCORE W/ CA W/CM &/OR W/O CM
1 series · 10 of 12 positions shown, 13 images · non-contrast
Comparison: None.
COMPARISON: None.

Addendum:
EXAM:
OVER-READ INTERPRETATION  CT CHEST

The following report is an over-read performed by radiologist Dr.
Normurati Sudah [REDACTED] on 06/29/2021. This
over-read does not include interpretation of cardiac or coronary
anatomy or pathology. The coronary calcium score/coronary CTA
interpretation by the cardiologist is attached.
CLINICAL DATA: 60M with chest pain
Cardiac/Coronary CTA
TECHNIQUE: The patient was scanned on a [employer] scanner.

[Series 3387: coronaries · 10 of 12 slices shown, 13 images]
[im 2/12  vessel]
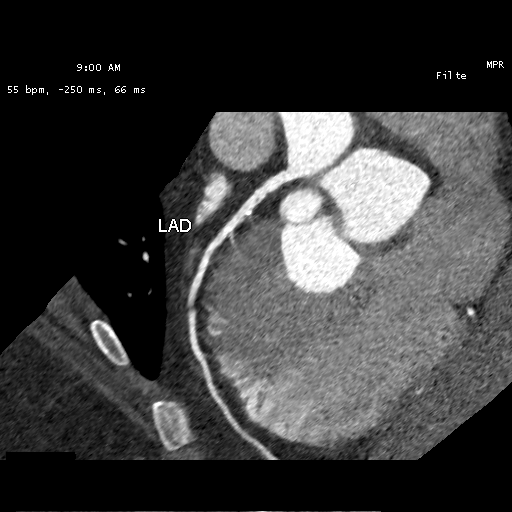
[im 2/12  lung]
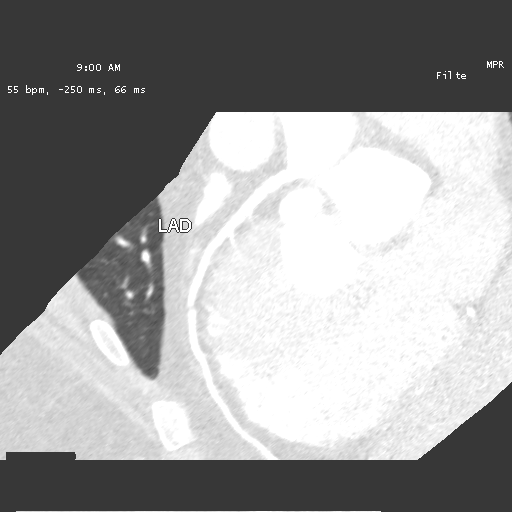
[im 3/12  vessel]
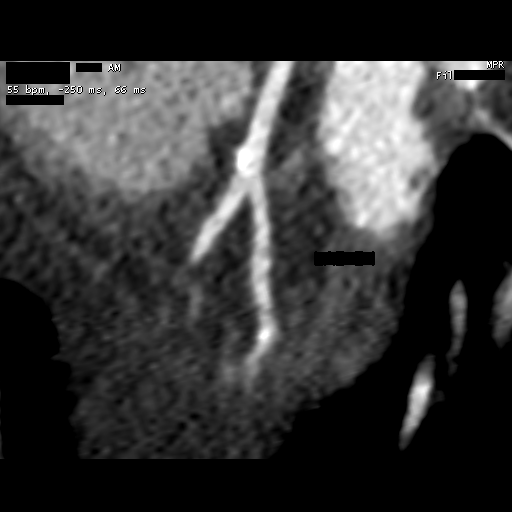
[im 4/12  vessel]
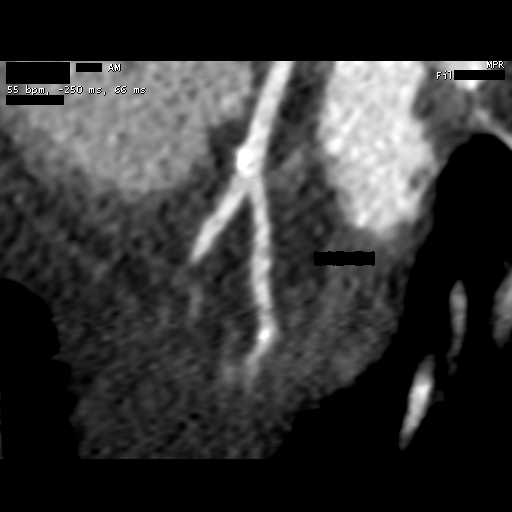
[im 5/12  vessel]
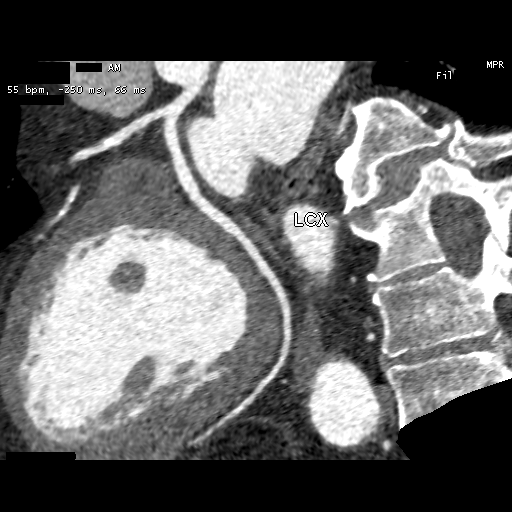
[im 6/12  vessel]
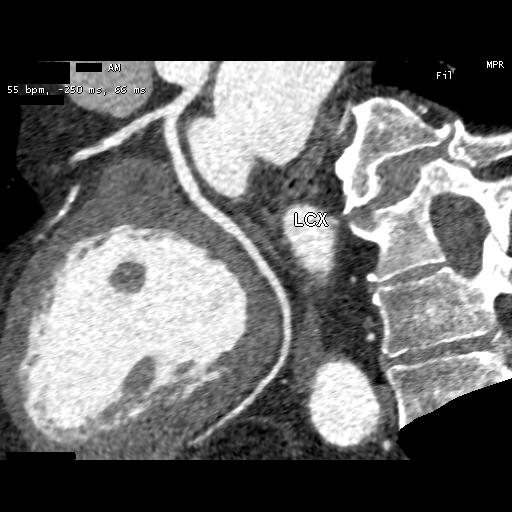
[im 6/12  lung]
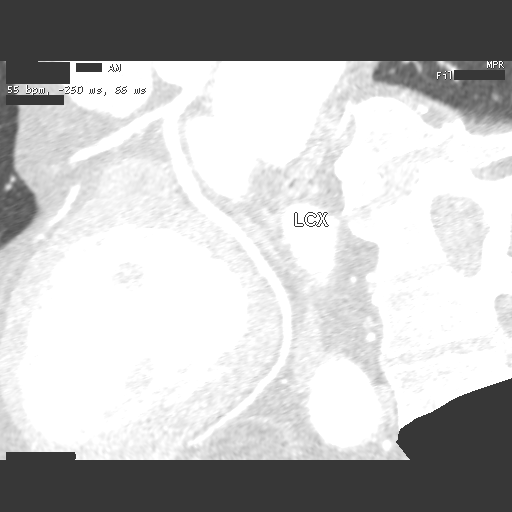
[im 7/12  vessel]
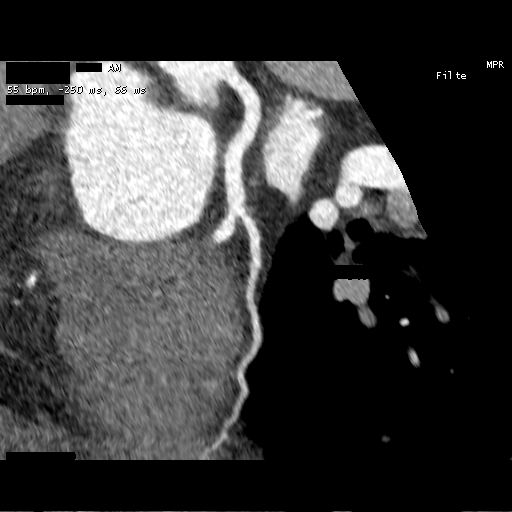
[im 8/12  vessel]
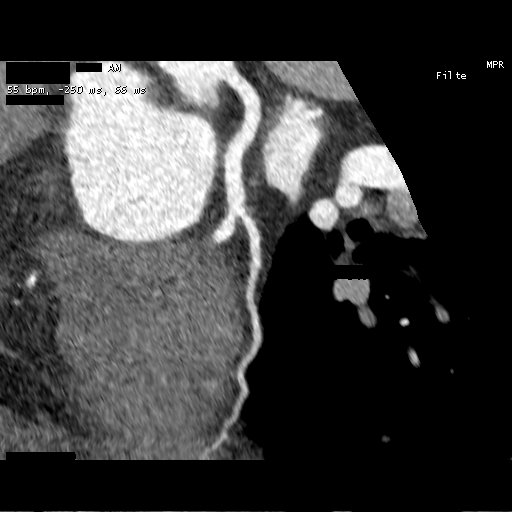
[im 9/12  vessel]
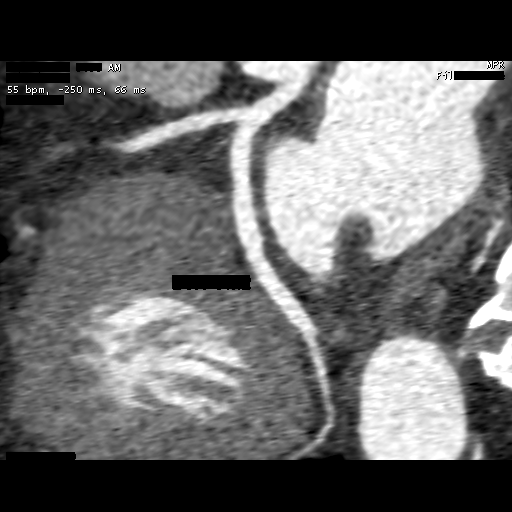
[im 10/12  vessel]
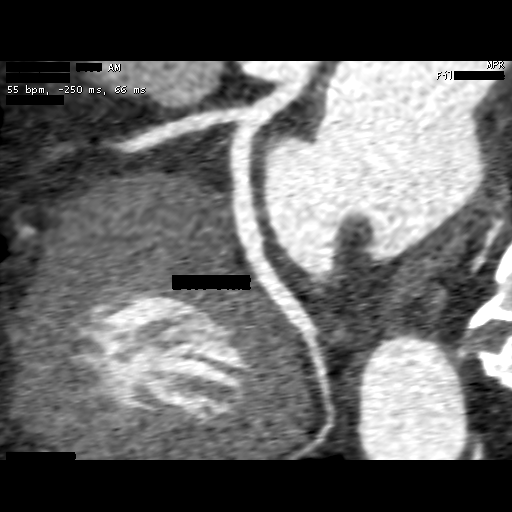
[im 10/12  lung]
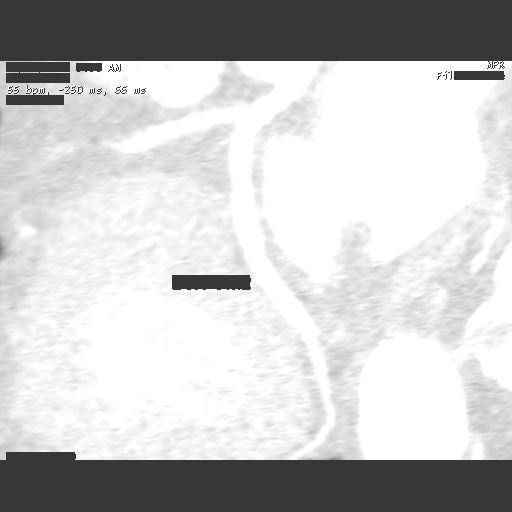
[im 11/12  vessel]
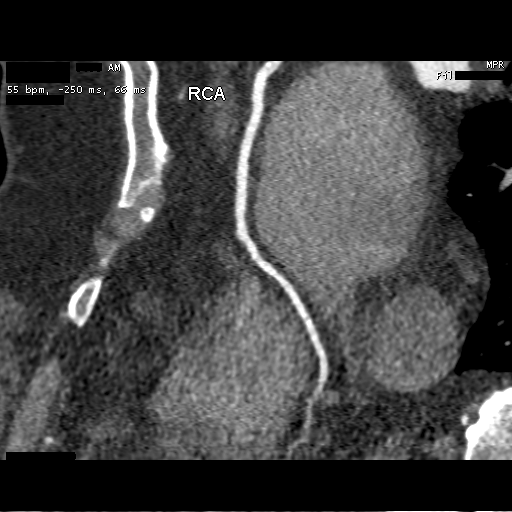

[10 of 12 positions shown; findings below may reference images not displayed]

FINDINGS: Aortic atherosclerosis. Within the visualized portions of the thorax
there are no suspicious appearing pulmonary nodules or masses, there
is no acute consolidative airspace disease, no pleural effusions, no
pneumothorax and no lymphadenopathy. Visualized portions of the
upper abdomen demonstrates diffuse low attenuation throughout the
visualized hepatic parenchyma, indicative of severe hepatic
steatosis. There are no aggressive appearing lytic or blastic
lesions noted in the visualized portions of the skeleton.
IMPRESSION: 1.  Aortic Atherosclerosis (CBD9K-3XW.W).
2. Hepatic steatosis.
FINDINGS: A 100 kV prospective scan was triggered in the descending thoracic
aorta at 111 HU's. Axial non-contrast 3 mm slices were carried out
through the heart. The data set was analyzed on a dedicated work
station and scored using the Agatson method. Gantry rotation speed
was 250 msecs and collimation was .6 mm. 0.8 mg of sl NTG was given.
The 3D data set was reconstructed in 5% intervals of the R-R cycle.
Phases were analyzed on a dedicated work station using MPR, MIP and
VRT modes. The patient received 80 cc of contrast.

Aorta: Normal size.

Aortic Valve: Bicuspid, mild calcifications

Coronary Arteries:  Normal coronary origin.  Right dominance.

RCA is a codominant artery. Calcified plaque in the proximal RCA
causes 0-24% stenosis

Left main is a large artery that gives rise to LAD and LCX arteries.

LAD is a large vessel. Calcified plaque in the proximal LAD causes
0-24% stenosis. Calcified plaque in the mid LAD causes 0-24%
stenosis. Calcified plaque in D1 causes 25-49% stenosis but small
vessel (~2mm)

LCX is a codominant artery. Calcified plaque in the proximal LCX
causes 0-24% stenosis. Noncalcified plaque in the ostium of OM1
causes 0-24% stenosis

Other findings:

Left Ventricle: Mild dilatation

Left Atrium: Mild enlargement

Pulmonary Veins: Normal configuration

Right Ventricle: Mild dilatation

Right Atrium: Mild enlargement

Cardiac valves: Bicuspid aortic valve with fusion of right and
noncoronary cusps, mild calcifications (AV calcium score 155)

Thoracic aorta: Normal size

Pulmonary Arteries: Normal size

Systemic Veins: Normal drainage

Pericardium: Normal thickness
IMPRESSION: 1. Coronary calcium score of 49. This was 54th percentile for age
and sex matched control.

2. Normal coronary origin with codominance.

3. Poor quality study with artifact due to noise likely secondary to
obesity and step artifacts from cardiac motion. However no
obstructive CAD seen

4. Nonobstructive CAD with calcified plaque causing mild (25-49%)
stenosis in D1 (though small vessel, ~2mm), minimal (0-24%)
stenosis in proximal/mid LAD, proximal LCX, and proximal RCA

5. Bicuspid aortic valve with fusion of right and noncoronary cusps.
Mild AV calcifications (AV calcium score 155)

CAD-RADS 2. Mild non-obstructive CAD (25-49%). Consider
non-atherosclerotic causes of chest pain. Consider preventive
therapy and risk factor modification.

*** End of Addendum ***
EXAM:
OVER-READ INTERPRETATION  CT CHEST

The following report is an over-read performed by radiologist Dr.
Normurati Sudah [REDACTED] on 06/29/2021. This
over-read does not include interpretation of cardiac or coronary
anatomy or pathology. The coronary calcium score/coronary CTA
interpretation by the cardiologist is attached.
FINDINGS: Aortic atherosclerosis. Within the visualized portions of the thorax
there are no suspicious appearing pulmonary nodules or masses, there
is no acute consolidative airspace disease, no pleural effusions, no
pneumothorax and no lymphadenopathy. Visualized portions of the
upper abdomen demonstrates diffuse low attenuation throughout the
visualized hepatic parenchyma, indicative of severe hepatic
steatosis. There are no aggressive appearing lytic or blastic
lesions noted in the visualized portions of the skeleton.
IMPRESSION: 1.  Aortic Atherosclerosis (CBD9K-3XW.W).
2. Hepatic steatosis.

## 2022-06-17 LAB — HEPATIC FUNCTION PANEL
ALT: 79 IU/L — ABNORMAL HIGH (ref 0–44)
AST: 58 IU/L — ABNORMAL HIGH (ref 0–40)
Albumin: 5 g/dL — ABNORMAL HIGH (ref 3.9–4.9)
Alkaline Phosphatase: 130 IU/L — ABNORMAL HIGH (ref 44–121)
Bilirubin Total: 0.7 mg/dL (ref 0.0–1.2)
Bilirubin, Direct: 0.2 mg/dL (ref 0.00–0.40)
Total Protein: 7.5 g/dL (ref 6.0–8.5)

## 2022-06-17 LAB — URIC ACID: Uric Acid: 7.6 mg/dL (ref 3.8–8.4)

## 2022-06-17 LAB — LIPID PANEL
Chol/HDL Ratio: 3.5 ratio (ref 0.0–5.0)
Cholesterol, Total: 201 mg/dL — ABNORMAL HIGH (ref 100–199)
HDL: 57 mg/dL (ref >39)
LDL Chol Calc (NIH): 110 mg/dL — ABNORMAL HIGH (ref 0–99)
Triglycerides: 197 mg/dL — ABNORMAL HIGH (ref 0–149)
VLDL Cholesterol Cal: 34 mg/dL (ref 5–40)

## 2022-06-18 ENCOUNTER — Other Ambulatory Visit: Payer: Self-pay

## 2022-06-18 DIAGNOSIS — R7989 Other specified abnormal findings of blood chemistry: Secondary | ICD-10-CM

## 2022-08-03 ENCOUNTER — Ambulatory Visit (HOSPITAL_COMMUNITY): Payer: BC Managed Care – PPO | Attending: Physician Assistant

## 2022-08-03 DIAGNOSIS — E119 Type 2 diabetes mellitus without complications: Secondary | ICD-10-CM | POA: Diagnosis not present

## 2022-08-03 DIAGNOSIS — I350 Nonrheumatic aortic (valve) stenosis: Secondary | ICD-10-CM | POA: Insufficient documentation

## 2022-08-03 DIAGNOSIS — Q231 Congenital insufficiency of aortic valve: Secondary | ICD-10-CM

## 2022-08-03 DIAGNOSIS — I10 Essential (primary) hypertension: Secondary | ICD-10-CM | POA: Diagnosis not present

## 2022-08-03 LAB — ECHOCARDIOGRAM COMPLETE
AR max vel: 1.45 cm2
AV Area VTI: 1.37 cm2
AV Area mean vel: 1.33 cm2
AV Mean grad: 11 mmHg
AV Peak grad: 20.8 mmHg
Ao pk vel: 2.28 m/s
Area-P 1/2: 3.72 cm2
S' Lateral: 3.6 cm

## 2022-08-18 ENCOUNTER — Telehealth: Payer: Self-pay | Admitting: Cardiology

## 2022-08-18 NOTE — Telephone Encounter (Signed)
Patient would like to know if he can be referred to [facility], he states his legs are swelling and are turning very brown on the bottom. He also stated he had lab work done a few months back and he was advised his liver enzymes were high, he stated he was told he was going to be referred to a liver specialist but he never heard from anyone.

## 2022-08-18 NOTE — Telephone Encounter (Signed)
Returned call to patient-patient requesting referral to vascular specialist due to his legs. Reports legs are swollen and turning brown from his ankle up.  Reports pain in BL LE at rest.  Reports pain decreases with elevation.   Advised may need further testing/studies prior to referral.     Also reports being referred to GI previously but has not received a call.    Per chart review:  referred to [facility] in October.    Patient reports seeing Dr. Jenetta Downer in [place] who is a GI doctor.  Advised no need for referral or new GI doc, follow up with current GI doctor.   Patient request labs sent to Dr. Tamela Oddi sent.   Patient will call to request follow up appt.  Sent to MD to review.

## 2022-08-19 NOTE — Telephone Encounter (Signed)
Appt scheduled, patient aware.

## 2022-08-24 ENCOUNTER — Encounter: Payer: Self-pay | Admitting: Physician Assistant

## 2022-08-24 ENCOUNTER — Ambulatory Visit: Payer: BC Managed Care – PPO | Attending: Physician Assistant | Admitting: Physician Assistant

## 2022-08-24 VITALS — BP 132/81 | HR 71 | Ht 71.0 in | Wt 283.0 lb

## 2022-08-24 DIAGNOSIS — Q231 Congenital insufficiency of aortic valve: Secondary | ICD-10-CM | POA: Diagnosis not present

## 2022-08-24 DIAGNOSIS — I2510 Atherosclerotic heart disease of native coronary artery without angina pectoris: Secondary | ICD-10-CM | POA: Diagnosis not present

## 2022-08-24 DIAGNOSIS — L819 Disorder of pigmentation, unspecified: Secondary | ICD-10-CM

## 2022-08-24 DIAGNOSIS — I10 Essential (primary) hypertension: Secondary | ICD-10-CM | POA: Diagnosis not present

## 2022-08-24 NOTE — Patient Instructions (Addendum)
Medication Instructions:   Your physician recommends that you continue on your current medications as directed. Please refer to the Current Medication list given to you today.  *If you need a refill on your cardiac medications before your next appointment, please call your pharmacy*  Lab Work: NONE ordered at this time of appointment   If you have labs (blood work) drawn today and your tests are completely normal, you will receive your results only by: [facility] (if you have MyChart) OR A paper copy in the mail If you have any lab test that is abnormal or we need to change your treatment, we will call you to review the results.  Testing/Procedures: Your physician has requested that you have a lower extremity venous duplex. This test is an ultrasound of the veins in the legs. It looks at venous blood flow that carries blood from the heart to the legs or arms. Allow one hour for a Lower Venous exam. Allow thirty minutes for an Upper Venous exam. There are no restrictions or special instructions.  Schedule within 2 weeks    Follow-Up: At [facility], you and your health needs are our priority.  As part of our continuing mission to provide you with exceptional heart care, we have created designated Provider Care Teams.  These Care Teams include your primary Cardiologist (physician) and Advanced Practice Providers (APPs -  Physician Assistants and Nurse Practitioners) who all work together to provide you with the care you need, when you need it.   Your next appointment:   As previously scheduled   The format for your next appointment:   In Person  Provider:   Kirk Ruths, MD     Other Instructions   Almyra Deforest, PA-C recommends Below knee COMPRESSION STOCKINGS  -- 20-30 mmHg (compression strength) -- [facility]  -- [address]  -- [place]   -- [place]  -- [address] [place]  -- [phone]   How to Use Compression Stockings Compression stockings are elastic socks that squeeze the legs. They help to increase blood flow to the legs, decrease swelling in the legs, and reduce the chance of developing blood clots in the lower legs. Compression stockings are often used by people who: Are recovering from surgery. Have poor circulation in their legs. Are prone to getting blood clots in their legs. Have varicose veins. Sit or stay in bed for long periods of time. How to use compression stockings Before you put on your compression stockings: Make sure that they are the correct size. If you do not know your size, ask your health care provider. Make sure that they are clean, dry, and in good condition. Check them for rips and tears. Do not put them on if they are ripped or torn. Put your stockings on first thing in the morning, before you get out of bed. Keep them on for as long as your health care provider advises. When you are wearing your stockings: Keep them as smooth as possible. Do not allow them to bunch up. It is especially important to prevent the stockings from bunching up around your toes or behind your knees. Do not roll the stockings downward and leave them rolled down. This can decrease blood flow to your leg. Change them right away if they become wet or dirty. When you take off your stockings, inspect your legs and  feet. Anything that does not seem normal may require medical attention. Look for: Open sores. Red spots. Swelling. Information and tips Do not stop wearing your compression stockings without talking to your health care provider first. Wash your stockings every day with mild detergent in cold or warm water. Do not use bleach. Air-dry your stockings or dry them in a clothes dryer on low heat. Replace your stockings every 3-6 months. If skin moisturizing is part of your treatment  plan, apply lotion or cream at night so that your skin will be dry when you put on the stockings in the morning. It is harder to put the stockings on when you have lotion on your legs or feet. Contact a health care provider if: Remove your stockings and seek medical care if: You have a feeling of pins and needles in your feet or legs. You have any new changes in your skin. You have skin lesions that are getting worse. You have swelling or pain that is getting worse. Get help right away if: You have numbness or tingling in your lower legs that does not get better right after you take the stockings off. Your toes or feet become cold and blue. You develop open sores or red spots on your legs that do not go away. You see or feel a warm spot on your leg. You have new swelling or soreness in your leg. You are short of breath or you have chest pain for no reason. You have a rapid or irregular heartbeat. You feel light-headed or dizzy. This information is not intended to replace advice given to you by your health care provider. Make sure you discuss any questions you have with your health care provider. Document Released: 06/06/2009 Document Revised: 01/07/2016 Document Reviewed: 07/17/2014 [employer] Interactive Patient Education  2017 [employer]

## 2022-08-24 NOTE — Progress Notes (Signed)
Cardiology Office Note:    Date:  08/24/2022   ID:  Bryan Austin, DOB 1961-05-20, MRN [id number]  PCP:  Ranae Palms, [place] Providers Cardiologist:  Kirk Ruths, MD     Referring MD: Ranae Palms, MD   Chief Complaint  Patient presents with   Follow-up    Seen for Dr. Stanford Breed    History of Present Illness:    Bryan Austin is a 62 y.o. male with a hx of bicuspid aortic valve, OSA managed by pulmonology, hypertension and history of coronary artery calcification.  Coronary CTA obtained in November 2022 showed coronary calcium score 49 which placed the patient in the 54th percentile with age and sex matched control, aortic valve was Bicuspid with aortic valve calcium score of 155, normal aortic size.  Poor quality study with artifact due to noise likely secondary to obesity and step artifacts from cardiac motion but no obstructive CAD was seen, nonobstructive CAD with 25 to 49% stenosis in D1, minimal 0 to 24% stenosis in proximal to mid LAD, proximal left circumflex and proximal RCA.  Echocardiogram in December 2022 showed normal EF, mild to moderate LAE, mild MR, bicuspid aortic valve with mild aortic stenosis with mean gradient 11.5 mmHg.  Patient was last seen by Dr. Stanford Breed in May 2022 at which time he had dyspnea with more vigorous activity but not with routine activity. Unfortunately the Crestor was causing his LFT to be elevated and had to be discontinued.  He was seen by clinical pharmacist who started him on Nexlizet.  More recently, patient presented to the ED on 04/10/2022 with dysphagia after swallowing pill.  Troponin was negative. Patient was treated with maalox and Pepcid.  I saw the patient on 04/20/2022, his symptom was more consistent with GI issue in nature, therefore I did not recommend any ischemic workup.  I did repeat echocardiogram on 08/03/2022 which showed EF 60 to 65%, grade 2 DD, mild aortic stenosis, mild dilatation of the ascending aorta  measuring at 36 mm.  Patient presents today for evaluation of discoloration in the legs.  He has brownish discoloration right above both ankles.  There was no significant pain however he does have 1+ pitting edema in bilateral lower extremity.  On physical exam, he has 2+ posterior tibial pulse and 1+ dorsalis pedis pulse.  He denies any claudication symptoms with ambulation, in fact even despite the knee issue, his lower extremity symptoms feels better with walking.  Suspicion for significant PAD fairly low.  Symptom more consistent with venous stasis dermatitis related to chronic venous insufficiency.  We discussed treatment including compression stocking, controlling diabetes, and weight loss.  I recommend a venous Doppler with reflux.  Depend on the result of venous Doppler, I likely will refer the patient to vascular surgery.  He can keep the currently scheduled follow-up with Dr. Stanford Breed next month.  Past Medical History:  Diagnosis Date   Arthritis    Neck C3,C4, C5   Diabetes mellitus without complication ([place])    GERD without esophagitis 2016   NEG CARDIAC EVALUATION [facility]   Gout    Hypertension     Past Surgical History:  Procedure Laterality Date   CHOLECYSTECTOMY  2006   GB FULL OF "SAND", NEEDED A DRAIN DUE TO INFECTION   COLONOSCOPY  [facility]   COLONOSCOPY N/A 10/14/2014   Procedure: COLONOSCOPY;  Surgeon: Danie Binder, MD;  [facility];  Service: Endoscopy;  Laterality: N/A;  100pm   COLONOSCOPY WITH PROPOFOL N/A 02/24/2021   Procedure: COLONOSCOPY WITH PROPOFOL;  Surgeon: Harvel Quale, MD;  [facility];  Service: Gastroenterology;  Laterality: N/A;  8:35   ESOPHAGOGASTRODUODENOSCOPY N/A 10/14/2014   Procedure: ESOPHAGOGASTRODUODENOSCOPY (EGD);  Surgeon: Danie Binder, MD;  [facility];  Service: Endoscopy;  Laterality: N/A;   HEMORRHOID BANDING N/A 10/14/2014   Procedure: HEMORRHOID BANDING;  Surgeon: Danie Binder, MD;  [facility];  Service: Endoscopy;  Laterality: N/A;   HEMOSTASIS CLIP PLACEMENT  02/24/2021   Procedure: HEMOSTASIS CLIP PLACEMENT;  Surgeon: Harvel Quale, MD;  [facility];  Service: Gastroenterology;;   HERNIA REPAIR  [id number]   COMPLICATED BY PROLONGED ILEUS   POLYPECTOMY  02/24/2021   Procedure: POLYPECTOMY;  Surgeon: Montez Morita, Quillian Quince, MD;  [facility];  Service: Gastroenterology;;    Current Medications: Current Meds  Medication Sig   aspirin EC 81 MG tablet Take 1 tablet (81 mg total) by mouth daily. Swallow whole.   Cholecalciferol (VITAMIN D3 PO) Take 1 capsule by mouth daily.   empagliflozin (JARDIANCE) 10 MG TABS tablet Take 10 mg by mouth daily.   irbesartan (AVAPRO) 150 MG tablet Take 1 tablet (150 mg total) by mouth daily.   Melatonin 5 MG TABS Take 10 mg by mouth at bedtime.   metFORMIN (GLUCOPHAGE-XR) 500 MG 24 hr tablet Take 500 mg by mouth See admin instructions. Take 500 mg daily, may take a second 500 mg dose in the evening as needed for high blood sugar     Allergies:   Adhesive [tape] and Bee venom   Social History   Socioeconomic History   Marital status: Married    Spouse name: Not on file   Number of children: Not on file   Years of education: Not on file   Highest education level: Not on file  Occupational History   Not on file  Tobacco Use   Smoking status: Never   Smokeless tobacco: Never  Vaping Use   Vaping Use: Never used  Substance and Sexual Activity   Alcohol use: Never    Alcohol/week: 0.0 standard drinks of alcohol   Drug use: Never   Sexual activity: Yes  Other Topics Concern   Not on file  Social History Narrative   Part owner OF [employer]   Social Determinants of Health   Financial Resource Strain: Not on file  Food Insecurity: Not on file  Transportation Needs: Not on file  Physical Activity: Not on file  Stress: Not on file  Social Connections: Not on file     Family  History: The patient's family history includes Cancer in his mother; Colon cancer in his maternal grandmother; Colon polyps in his mother; Diabetes in his father; Heart failure in his father; Kidney disease in his mother.  ROS:   Please see the history of present illness.     All other systems reviewed and are negative.  EKGs/Labs/Other Studies Reviewed:    The following studies were reviewed today:  Echo 08/03/2022 1. Left ventricular ejection fraction, by estimation, is 60 to 65%. The  left ventricle has normal function. The left ventricle has no regional  wall motion abnormalities. Left ventricular diastolic parameters are  consistent with Grade II diastolic  dysfunction (pseudonormalization). Elevated left ventricular end-diastolic  pressure.   2. Right ventricular systolic function is normal. The right ventricular  size is normal.   3. The mitral valve is  normal in structure. Trivial mitral valve  regurgitation. No evidence of mitral stenosis.   4. The aortic valve is calcified. There is mild calcification of the  aortic valve. There is mild thickening of the aortic valve. Aortic valve  regurgitation is not visualized. Mild aortic valve stenosis. Aortic valve  area, by VTI measures 1.37 cm.  Aortic valve mean gradient measures 11.0 mmHg. Aortic valve Vmax measures  2.28 m/s.   5. Aortic dilatation noted. There is mild dilatation of the ascending  aorta, measuring 36 mm.   6. The inferior vena cava is normal in size with <50% respiratory  variability, suggesting right atrial pressure of 8 mmHg.   EKG:  EKG is not ordered today.    Recent Labs: 01/29/2022: BUN 14; Creatinine, Ser 1.14; Potassium 4.8; Sodium 141 06/16/2022: ALT 79  Recent Lipid Panel    Component Value Date/Time   CHOL 201 (H) 06/16/2022 1028   TRIG 197 (H) 06/16/2022 1028   HDL 57 06/16/2022 1028   CHOLHDL 3.5 06/16/2022 1028   LDLCALC 110 (H) 06/16/2022 1028     Risk Assessment/Calculations:            Physical Exam:    VS:  BP 132/81   Pulse 71   Ht '5\' 11"'$  (1.803 m)   Wt 283 lb (128.4 kg)   SpO2 95%   BMI 39.47 kg/m         Wt Readings from Last 3 Encounters:  08/24/22 283 lb (128.4 kg)  04/20/22 299 lb (135.6 kg)  04/09/22 300 lb (136.1 kg)     GEN:  Well nourished, well developed in no acute distress HEENT: Normal NECK: No JVD; No carotid bruits LYMPHATICS: No lymphadenopathy CARDIAC: RRR, no murmurs, rubs, gallops RESPIRATORY:  Clear to auscultation without rales, wheezing or rhonchi  ABDOMEN: Soft, non-tender, non-distended MUSCULOSKELETAL:  No edema; No deformity  SKIN: Warm and dry NEUROLOGIC:  Alert and oriented x 3 PSYCHIATRIC:  Normal affect   ASSESSMENT:    1. Discoloration of skin of lower leg   2. Coronary artery disease involving native coronary artery of native heart without angina pectoris   3. Essential hypertension   4. Bicuspid aortic valve    PLAN:    In order of problems listed above:  Discoloration of lower extremity: Patient has brownish discoloration right above both ankles.  He denies any claudication symptoms.  He has good pulses in the bilateral lower extremity.  Suspicion for significant PAD is fairly low.  I suspect the brownish discoloration in the lower extremity is venous stasis dermatitis.  We discussed the importance of compression stocking and leg elevation.  I will obtain a venous Doppler with reflux  CAD: Denies any recent chest pain  Hypertension: Blood pressure stable  Bicuspid aortic valve: Recent echocardiogram obtained on 08/03/2022 showed EF of 60 to 65%, mild aortic stenosis.        Medication Adjustments/Labs and Tests Ordered: Current medicines are reviewed at length with the patient today.  Concerns regarding medicines are outlined above.  Orders Placed This Encounter  Procedures   VAS [place] LOWER EXTREMITY VENOUS REFLUX   No orders of the defined types were placed in this encounter.   Patient  Instructions  Medication Instructions:   Your physician recommends that you continue on your current medications as directed. Please refer to the Current Medication list given to you today.  *If you need a refill on your cardiac medications before your next appointment, please call your pharmacy*  Lab Work: NONE ordered at this time of appointment   If you have labs (blood work) drawn today and your tests are completely normal, you will receive your results only by: [place] (if you have MyChart) OR A paper copy in the mail If you have any lab test that is abnormal or we need to change your treatment, we will call you to review the results.  Testing/Procedures: Your physician has requested that you have a lower extremity venous duplex. This test is an ultrasound of the veins in the legs. It looks at venous blood flow that carries blood from the heart to the legs or arms. Allow one hour for a Lower Venous exam. Allow thirty minutes for an Upper Venous exam. There are no restrictions or special instructions.  Schedule within 2 weeks    Follow-Up: At [facility], you and your health needs are our priority.  As part of our continuing mission to provide you with exceptional heart care, we have created designated Provider Care Teams.  These Care Teams include your primary Cardiologist (physician) and Advanced Practice Providers (APPs -  Physician Assistants and Nurse Practitioners) who all work together to provide you with the care you need, when you need it.   Your next appointment:   As previously scheduled   The format for your next appointment:   In Person  Provider:   Kirk Ruths, MD     Other Instructions   Almyra Deforest, PA-C recommends Below knee COMPRESSION STOCKINGS  -- 20-30 mmHg (compression strength) -- [facility]  -- [address]  -- [place]. [place]   -- [place]  -- [address] [place]  -- [phone]   How to Use Compression Stockings Compression stockings are elastic socks that squeeze the legs. They help to increase blood flow to the legs, decrease swelling in the legs, and reduce the chance of developing blood clots in the lower legs. Compression stockings are often used by people who: Are recovering from surgery. Have poor circulation in their legs. Are prone to getting blood clots in their legs. Have varicose veins. Sit or stay in bed for long periods of time. How to use compression stockings Before you put on your compression stockings: Make sure that they are the correct size. If you do not know your size, ask your health care provider. Make sure that they are clean, dry, and in good condition. Check them for rips and tears. Do not put them on if they are ripped or torn. Put your stockings on first thing in the morning, before you get out of bed. Keep them on for as long as your health care provider advises. When you are wearing your stockings: Keep them as smooth as possible. Do not allow them to bunch up. It is especially important to prevent the stockings from bunching up around your toes or behind your knees. Do not roll the stockings downward and leave them rolled down. This can decrease blood flow to your leg. Change them right away if they become wet or dirty. When you take off your stockings, inspect your legs and feet. Anything that does not seem normal may require medical attention. Look for: Open sores. Red spots. Swelling. Information and tips Do not stop wearing your compression stockings without talking to your health care provider first. Wash your stockings every day with mild detergent in cold  or warm water. Do not use bleach. Air-dry your stockings or dry them in a clothes dryer on low heat. Replace your stockings every 3-6 months. If skin moisturizing is part of your  treatment plan, apply lotion or cream at night so that your skin will be dry when you put on the stockings in the morning. It is harder to put the stockings on when you have lotion on your legs or feet. Contact a health care provider if: Remove your stockings and seek medical care if: You have a feeling of pins and needles in your feet or legs. You have any new changes in your skin. You have skin lesions that are getting worse. You have swelling or pain that is getting worse. Get help right away if: You have numbness or tingling in your lower legs that does not get better right after you take the stockings off. Your toes or feet become cold and blue. You develop open sores or red spots on your legs that do not go away. You see or feel a warm spot on your leg. You have new swelling or soreness in your leg. You are short of breath or you have chest pain for no reason. You have a rapid or irregular heartbeat. You feel light-headed or dizzy. This information is not intended to replace advice given to you by your health care provider. Make sure you discuss any questions you have with your health care provider. Document Released: 06/06/2009 Document Revised: 01/07/2016 Document Reviewed: 07/17/2014 Elsevier Interactive Patient Education  2017 [place]         Signed, [place], [place]  08/24/2022 7:50 PM    [place]

## 2022-08-26 ENCOUNTER — Ambulatory Visit (HOSPITAL_COMMUNITY)
Admission: RE | Admit: 2022-08-26 | Discharge: 2022-08-26 | Disposition: A | Discharge status: Home / Self Care | Payer: BC Managed Care – PPO | Source: Ambulatory Visit | Attending: Cardiology | Admitting: Cardiology

## 2022-08-26 DIAGNOSIS — L819 Disorder of pigmentation, unspecified: Secondary | ICD-10-CM

## 2022-10-11 ENCOUNTER — Ambulatory Visit: Payer: BC Managed Care – PPO | Admitting: Cardiology

## 2022-10-15 ENCOUNTER — Other Ambulatory Visit: Payer: Self-pay | Admitting: Family

## 2022-10-15 DIAGNOSIS — M899 Disorder of bone, unspecified: Secondary | ICD-10-CM

## 2022-11-01 ENCOUNTER — Ambulatory Visit
Admission: RE | Admit: 2022-11-01 | Discharge: 2022-11-01 | Disposition: A | Discharge status: Home / Self Care | Payer: BC Managed Care – PPO | Source: Ambulatory Visit | Attending: Family | Admitting: Family

## 2022-11-01 DIAGNOSIS — M899 Disorder of bone, unspecified: Secondary | ICD-10-CM

## 2022-12-02 NOTE — Progress Notes (Signed)
HPI: FU bicuspid aortic valve. Coronary CTA November 2022 showed calcium score 49 which was 54th percentile, mild plaque in the first diagonal and no other obstructive disease noted.  The aortic valve is bicuspid with aortic valve calcium score of 155; aorta normal size.  Echocardiogram December 2023 showed normal LV function, grade 2 diastolic dysfunction, mild aortic stenosis with mean gradient 11 mmHg.  Since last seen he has dyspnea with more vigorous activities but not routine activities.  No orthopnea, PND, pedal edema, chest pain or syncope.  Current Outpatient Medications  Medication Sig Dispense Refill   aspirin EC 81 MG tablet Take 1 tablet (81 mg total) by mouth daily. Swallow whole. 90 tablet 3   Cholecalciferol (VITAMIN D3 PO) Take 1 capsule by mouth daily.     empagliflozin (JARDIANCE) 10 MG TABS tablet Take 10 mg by mouth daily.     irbesartan (AVAPRO) 150 MG tablet Take 1 tablet (150 mg total) by mouth daily. 90 tablet 3   Melatonin 5 MG TABS Take 10 mg by mouth at bedtime.     metFORMIN (GLUCOPHAGE-XR) 500 MG 24 hr tablet Take 500 mg by mouth See admin instructions. Take 500 mg daily, may take a second 500 mg dose in the evening as needed for high blood sugar     No current facility-administered medications for this visit.     Past Medical History:  Diagnosis Date   Arthritis    Neck C3,C4, C5   Diabetes mellitus without complication    GERD without esophagitis 2016   [facility]G CARDIAC EVALUATION [facility]   Gout    Hypertension     Past Surgical History:  Procedure Laterality Date   CHOLECYSTECTOMY  2006   GB FULL OF "SAND", NEEDED A DRAIN DUE TO INFECTION   COLONOSCOPY  [facility]   COLONOSCOPY N/A 10/14/2014   Procedure: COLONOSCOPY;  Surgeon: West BaliSandi L Fields, MD;  [facility];  Service: Endoscopy;  Laterality: N/A;  100pm   COLONOSCOPY WITH PROPOFOL N/A 02/24/2021   Procedure: COLONOSCOPY WITH PROPOFOL;  Surgeon: Dolores Frameastaneda Mayorga, Daniel, MD;  [facility];  Service: Gastroenterology;  Laterality: N/A;  8:35   ESOPHAGOGASTRODUODENOSCOPY N/A 10/14/2014   Procedure: ESOPHAGOGASTRODUODENOSCOPY (EGD);  Surgeon: West BaliSandi L Fields, MD;  [facility];  Service: Endoscopy;  Laterality: N/A;   HEMORRHOID BANDING N/A 10/14/2014   Procedure: HEMORRHOID BANDING;  Surgeon: West BaliSandi L Fields, MD;  [facility];  Service: Endoscopy;  Laterality: N/A;   HEMOSTASIS CLIP PLACEMENT  02/24/2021   Procedure: HEMOSTASIS CLIP PLACEMENT;  Surgeon: Dolores Frameastaneda Mayorga, Daniel, MD;  [facility];  Service: Gastroenterology;;   HERNIA REPAIR  2006   COMPLICATED BY PROLONGED ILEUS   POLYPECTOMY  02/24/2021   Procedure: POLYPECTOMY;  Surgeon: Dolores Frameastaneda Mayorga, Daniel, MD;  [facility];  Service: Gastroenterology;;    Social History   Socioeconomic History   Marital status: Married    Spouse name: Not on file   Number of children: Not on file   Years of education: Not on file   Highest education level: Not on file  Occupational History   Not on file  Tobacco Use   Smoking status: Never   Smokeless tobacco: Never  Vaping Use   Vaping Use: Never used  Substance and Sexual Activity   Alcohol use: Never    Alcohol/week: 0.0 standard drinks of alcohol   Drug use: Never   Sexual activity: Yes  Other Topics Concern  Not on file  Social History Narrative   Part owner OF [employer]   Social Determinants of Health   Financial Resource Strain: Not on file  Food Insecurity: Not on file  Transportation Needs: Not on file  Physical Activity: Not on file  Stress: Not on file  Social Connections: Not on file  Intimate Partner Violence: Not on file    Family History  Problem Relation Age of Onset   Cancer Mother    Colon polyps Mother    Kidney disease Mother    Heart failure Father    Diabetes Father    Colon cancer Maternal Grandmother     ROS: Knee arthralgias but no fevers or chills, productive cough,  hemoptysis, dysphasia, odynophagia, melena, hematochezia, dysuria, hematuria, rash, seizure activity, orthopnea, PND, pedal edema, claudication. Remaining systems are negative.  Physical Exam: Well-developed obese in no acute distress.  Skin is warm and dry.  HEENT is normal.  Neck is supple.  Chest is clear to auscultation with normal expansion.  Cardiovascular exam is regular rate and rhythm.  2/6 systolic murmur left sternal border. Abdominal exam nontender or distended. No masses palpated. Extremities show no edema. neuro grossly intact   A/P  1 bicuspid aortic valve/aortic stenosis-patient denies chest pain, dyspnea or syncope.  Plan will be to repeat echocardiogram December 2024.  2 coronary artery disease-mild on previous CTA.  Continue aspirin.  Intolerant to statins.  3 hypertension-blood pressure controlled.  Continue present medications.  4 hyperlipidemia-intolerant to statins.  Will refer to lipid clinic for PCSK9 inhibitor.  Note he had elevated liver functions in the past.  Will repeat and if remains elevated will refer to GI.  5 obstructive sleep apnea-Per pulmonary.  6 obesity-we discussed importance of weight loss.  Olga Millers, MD

## 2022-12-04 DIAGNOSIS — M1711 Unilateral primary osteoarthritis, right knee: Secondary | ICD-10-CM | POA: Insufficient documentation

## 2022-12-04 DIAGNOSIS — M1712 Unilateral primary osteoarthritis, left knee: Secondary | ICD-10-CM | POA: Insufficient documentation

## 2022-12-14 ENCOUNTER — Encounter: Payer: Self-pay | Admitting: Cardiology

## 2022-12-14 ENCOUNTER — Ambulatory Visit: Payer: BC Managed Care – PPO | Attending: Cardiology | Admitting: Cardiology

## 2022-12-14 VITALS — BP 134/86 | HR 75 | Ht 71.0 in | Wt 294.4 lb

## 2022-12-14 DIAGNOSIS — E7800 Pure hypercholesterolemia, unspecified: Secondary | ICD-10-CM | POA: Diagnosis not present

## 2022-12-14 DIAGNOSIS — Q231 Congenital insufficiency of aortic valve: Secondary | ICD-10-CM | POA: Diagnosis not present

## 2022-12-14 DIAGNOSIS — R7989 Other specified abnormal findings of blood chemistry: Secondary | ICD-10-CM | POA: Diagnosis not present

## 2022-12-14 NOTE — Addendum Note (Signed)
Addended by: Freddi Starr on: 12/14/2022 02:42 PM   Modules accepted: Orders

## 2022-12-14 NOTE — Patient Instructions (Signed)
    Testing/Procedures:  Your physician has requested that you have an echocardiogram. Echocardiography is a painless test that uses sound waves to create images of your heart. It provides your doctor with information about the size and shape of your heart and how well your heart's chambers and valves are working. This procedure takes approximately one hour. There are no restrictions for this procedure. Please do NOT wear cologne, perfume, aftershave, or lotions (deodorant is allowed). Please arrive 15 minutes prior to your appointment time. [address] [place]   Follow-Up: At [facility], you and your health needs are our priority.  As part of our continuing mission to provide you with exceptional heart care, we have created designated Provider Care Teams.  These Care Teams include your primary Cardiologist (physician) and Advanced Practice Providers (APPs -  Physician Assistants and Nurse Practitioners) who all work together to provide you with the care you need, when you need it.  We recommend signing up for the patient portal called "MyChart".  Sign up information is provided on this After Visit Summary.  MyChart is used to connect with patients for Virtual Visits (Telemedicine).  Patients are able to view lab/test results, encounter notes, upcoming appointments, etc.  Non-urgent messages can be sent to your provider as well.   To learn more about what you can do with MyChart, go to [email or web address].    Your next appointment:   12 month(s)  Provider:   Olga Millers, MD

## 2022-12-15 ENCOUNTER — Other Ambulatory Visit: Payer: Self-pay | Admitting: *Deleted

## 2022-12-15 ENCOUNTER — Telehealth: Payer: Self-pay | Admitting: *Deleted

## 2022-12-15 DIAGNOSIS — R748 Abnormal levels of other serum enzymes: Secondary | ICD-10-CM

## 2022-12-15 DIAGNOSIS — R7989 Other specified abnormal findings of blood chemistry: Secondary | ICD-10-CM

## 2022-12-15 LAB — HEPATIC FUNCTION PANEL
ALT: 37 IU/L (ref 0–44)
AST: 30 IU/L (ref 0–40)
Albumin: 4.2 g/dL (ref 3.9–4.9)
Alkaline Phosphatase: 132 IU/L — ABNORMAL HIGH (ref 44–121)
Bilirubin Total: 0.4 mg/dL (ref 0.0–1.2)
Bilirubin, Direct: 0.14 mg/dL (ref 0.00–0.40)
Total Protein: 6.6 g/dL (ref 6.0–8.5)

## 2022-12-15 NOTE — Telephone Encounter (Signed)
-----   Message from Lewayne Bunting, MD sent at 12/15/2022  7:28 AM EDT ----- Check 5'nucleotidase and GGT; check liver ultrasound Olga Millers

## 2022-12-15 NOTE — Telephone Encounter (Signed)
Spoke with pt, aware of results. Will try to add the labs onto what he had drawn yesterday. Will get the [place] at [facility] as that is closer for him.

## 2022-12-16 NOTE — Telephone Encounter (Signed)
Spoke with pt, aware labs have been to the blood that was already drawn. His US liver is scheduled for 01/03/23 @ 8:30 am at [facility]. NPO 6 hours prior.

## 2022-12-17 LAB — GAMMA GT: GGT: 50 IU/L (ref 0–65)

## 2022-12-17 LAB — SPECIMEN STATUS REPORT

## 2022-12-17 LAB — NUCLEOTIDASE, 5', BLOOD: 5-Nucleotidase: 4 IU/L (ref 0–11)

## 2023-01-03 ENCOUNTER — Other Ambulatory Visit (HOSPITAL_COMMUNITY): Payer: BC Managed Care – PPO

## 2023-01-06 ENCOUNTER — Ambulatory Visit: Payer: BC Managed Care – PPO | Admitting: Cardiology

## 2023-01-10 ENCOUNTER — Telehealth: Payer: Self-pay

## 2023-01-10 NOTE — Telephone Encounter (Signed)
   Pre-operative Risk Assessment    Patient Name: Bryan Austin  DOB: 1961/02/29 MRN: [id number]      Request for Surgical Clearance    Procedure:   RT TOTAL KNEE ARTHOPLASTY  Date of Surgery:  Clearance 04/11/23                                 Surgeon:  DR. Ollen Gross Surgeon's Group or Practice Name:  [facility] Phone number:  [phone] Fax number:  [phone]   Type of Clearance Requested:   - Medical  - Pharmacy:  Hold Aspirin NEEDS TO KNOW WHEN OR IF TO HOLD PRIOR.   Type of Anesthesia:   CHOICE    Additional requests/questions:    Signed, Michaelle Copas   01/10/2023, 2:29 PM

## 2023-01-11 ENCOUNTER — Telehealth: Payer: Self-pay

## 2023-01-11 NOTE — Telephone Encounter (Signed)
   Name: Bryan Austin  DOB: 21-Mar-1961  MRN: [id number]  Primary Cardiologist: Olga Millers, MD   Preoperative team, please contact this patient and set up a phone call appointment  on or after 02/09/2023 for further preoperative risk assessment. Please obtain consent and complete medication review. Thank you for your help.  I confirm that guidance regarding antiplatelet and oral anticoagulation therapy has been completed and, if necessary, noted below.  Per office protocol, if patient is without any new symptoms or concerns at the time of their virtual visit, he may hold Aspirin for 5-7 days prior to procedure. Please resume Aspirin as soon as possible postprocedure, at the discretion of the surgeon.     Joylene Grapes, NP 01/11/2023, 11:23 AM [facility]

## 2023-01-11 NOTE — Telephone Encounter (Signed)
  Patient Consent for Virtual Visit        Bryan Austin has provided verbal consent on 01/11/2023 for a virtual visit (video or telephone).   CONSENT FOR VIRTUAL VISIT FOR:  Bryan Austin  By participating in this virtual visit I agree to the following:  I hereby voluntarily request, consent and authorize [facility] and its employed or contracted physicians, physician assistants, nurse practitioners or other licensed health care professionals (the Practitioner), to provide me with telemedicine health care services (the "Services") as deemed necessary by the treating Practitioner. I acknowledge and consent to receive the Services by the Practitioner via telemedicine. I understand that the telemedicine visit will involve communicating with the Practitioner through live audiovisual communication technology and the disclosure of certain medical information by electronic transmission. I acknowledge that I have been given the opportunity to request an in-person assessment or other available alternative prior to the telemedicine visit and am voluntarily participating in the telemedicine visit.  I understand that I have the right to withhold or withdraw my consent to the use of telemedicine in the course of my care at any time, without affecting my right to future care or treatment, and that the Practitioner or I may terminate the telemedicine visit at any time. I understand that I have the right to inspect all information obtained and/or recorded in the course of the telemedicine visit and may receive copies of available information for a reasonable fee.  I understand that some of the potential risks of receiving the Services via telemedicine include:  Delay or interruption in medical evaluation due to technological equipment failure or disruption; Information transmitted may not be sufficient (e.g. poor resolution of images) to allow for appropriate medical decision making by the  Practitioner; and/or  In rare instances, security protocols could fail, causing a breach of personal health information.  Furthermore, I acknowledge that it is my responsibility to provide information about my medical history, conditions and care that is complete and accurate to the best of my ability. I acknowledge that Practitioner's advice, recommendations, and/or decision may be based on factors not within their control, such as incomplete or inaccurate data provided by me or distortions of diagnostic images or specimens that may result from electronic transmissions. I understand that the practice of medicine is not an exact science and that Practitioner makes no warranties or guarantees regarding treatment outcomes. I acknowledge that a copy of this consent can be made available to me via my patient portal [facility] MyChart), or I can request a printed copy by calling the office of [facility].    I understand that my insurance will be billed for this visit.   I have read or had this consent read to me. I understand the contents of this consent, which adequately explains the benefits and risks of the Services being provided via telemedicine.  I have been provided ample opportunity to ask questions regarding this consent and the Services and have had my questions answered to my satisfaction. I give my informed consent for the services to be provided through the use of telemedicine in my medical care

## 2023-01-11 NOTE — Telephone Encounter (Signed)
Spoke with patient who is agreeable to do tele visit on 7/26 at 10 am. Consent given but meds need to be reviewed.

## 2023-01-18 ENCOUNTER — Ambulatory Visit: Payer: BC Managed Care – PPO

## 2023-01-20 ENCOUNTER — Ambulatory Visit (HOSPITAL_COMMUNITY): Admission: RE | Admit: 2023-01-20 | Payer: BC Managed Care – PPO | Source: Ambulatory Visit

## 2023-01-26 ENCOUNTER — Other Ambulatory Visit (HOSPITAL_COMMUNITY): Payer: Self-pay

## 2023-01-26 ENCOUNTER — Ambulatory Visit: Payer: BC Managed Care – PPO | Attending: Cardiovascular Disease | Admitting: Pharmacist

## 2023-01-26 ENCOUNTER — Telehealth: Payer: Self-pay

## 2023-01-26 ENCOUNTER — Telehealth: Payer: Self-pay | Admitting: Pharmacist

## 2023-01-26 ENCOUNTER — Encounter: Payer: Self-pay | Admitting: Pharmacist

## 2023-01-26 DIAGNOSIS — I2510 Atherosclerotic heart disease of native coronary artery without angina pectoris: Secondary | ICD-10-CM | POA: Diagnosis not present

## 2023-01-26 DIAGNOSIS — E7849 Other hyperlipidemia: Secondary | ICD-10-CM | POA: Diagnosis not present

## 2023-01-26 DIAGNOSIS — E119 Type 2 diabetes mellitus without complications: Secondary | ICD-10-CM | POA: Insufficient documentation

## 2023-01-26 DIAGNOSIS — R931 Abnormal findings on diagnostic imaging of heart and coronary circulation: Secondary | ICD-10-CM | POA: Diagnosis not present

## 2023-01-26 DIAGNOSIS — Z7984 Long term (current) use of oral hypoglycemic drugs: Secondary | ICD-10-CM

## 2023-01-26 NOTE — Progress Notes (Signed)
Patient ID: Bryan Austin                 DOB: 05-27-1961                    MRN: [id number]     HPI: Bryan Austin is a 62 y.o. male patient referred to lipid clinic by Dr Jens Som. PMH is significant for HTN, CHF, CAD, elevated coronary calcium score, angina, obesity, and DM. Patient not currently on statins due to elevated LFTs.  Patient presents today to discuss HLD. Currently trying to diet and lose weight so he can have knee replacement at [facility].  Unable to perform much exercise. Reports that 8 years ago he was around 180# and was a frequent exerciser.  Reduced sugars in diet. Now uses Monkfruit in his sweet tea and has increased water intake. Continues to eat sausage biscuits however.  Denies tobacco and alcohol use.  Current Medications: N/A  Intolerances:  Statins  Risk Factors:  CAD DM CHF Coronary Calcium  LDL goal: <55  Labs:  TC 201, Trigs 197, HDL 57, LDL 110 (06/16/22)  Past Medical History:  Diagnosis Date   Arthritis    Neck C3,C4, C5   Diabetes mellitus without complication (HCC)    GERD without esophagitis 2016   NEG CARDIAC EVALUATION [facility]   Gout    Hypertension     Current Outpatient Medications on File Prior to Visit  Medication Sig Dispense Refill   aspirin EC 81 MG tablet Take 1 tablet (81 mg total) by mouth daily. Swallow whole. 90 tablet 3   Cholecalciferol (VITAMIN D3 PO) Take 1 capsule by mouth daily.     Colchicine 0.6 MG CAPS Take 1 capsule every day by oral route as needed.     cyanocobalamin (VITAMIN B12) 1000 MCG/ML injection Inject into the muscle.     empagliflozin (JARDIANCE) 10 MG TABS tablet Take 10 mg by mouth daily.     irbesartan (AVAPRO) 150 MG tablet Take 1 tablet (150 mg total) by mouth daily. 90 tablet 3   Melatonin 5 MG TABS Take 10 mg by mouth at bedtime.     metFORMIN (GLUCOPHAGE-XR) 500 MG 24 hr tablet Take 500 mg by mouth See admin instructions. Take 500 mg daily, may take a second 500 mg dose in the evening  as needed for high blood sugar     No current facility-administered medications on file prior to visit.    Allergies  Allergen Reactions   Adhesive [Tape] Itching and Rash   Bee Venom Rash    Assessment/Plan:  1. Hyperlipidemia - Patient last LDL 110 which is above goal of <55. Aggressive goal due to CAD and T2DM. Can not be on statins due to LFT elevations. Recommend starting PCSK9i,  Using demo pen, educated patient on mechanism of action, storage, site selection, administration, and possible adverse effects. Patient was able to demonstrate in room. Will complete PA and contact patient when approved. Advised to download copay card from manufacturer website.  Recommended continuing to avoid excess sugar and concentrate on vegetables, lean proteins, and whole grains. Patient voiced understanding.  Start Repatha/Prauent q 2 weeks Recheck lipid panel in 2-3 months  Bryan Austin, PharmD, BCACP, CDCES, CPP [address] , [place], [address] Phone: [phone], Fax: [phone]

## 2023-01-26 NOTE — Patient Instructions (Addendum)
It was nice meeting you today  We would like your LDL (bad cholesterol) to be less than 55  The medications we are recommending are called Repatha or Praluent. Both of which you will inject once every 2 weeks  I will complete the prior authorization for you and contact you when it is approved  Once you start the medication we will recheck your cholesterol levels in 2-3 months  Please message or call with any questions  Laural Golden, PharmD, BCACP, CDCES, CPP [address], [address] [place], [place], [address] Phone: [phone], Fax: [phone]

## 2023-01-26 NOTE — Telephone Encounter (Signed)
Please complete PA for Repatha or Praluent 

## 2023-01-26 NOTE — Telephone Encounter (Signed)
Pharmacy Patient Advocate Encounter   Received notification from [facility] that prior authorization for REPATHA 140MG /ML is required/requested.   PA submitted to [facility] via CoverMyMeds Key or (Medicaid) confirmation # [id number] Status is pending

## 2023-01-27 MED ORDER — REPATHA SURECLICK 140 MG/ML ~~LOC~~ SOAJ
1.0000 mL | SUBCUTANEOUS | 1 refills | Status: DC
Start: 2023-01-27 — End: 2023-10-05

## 2023-01-27 NOTE — Telephone Encounter (Signed)
Pharmacy Patient Advocate Encounter  Prior Authorization for Repatha has been APPROVED by [facility] from 01/26/2023 to 01/26/2024.   PA # [id number]

## 2023-03-17 NOTE — Progress Notes (Signed)
Virtual Visit via Telephone Note   Because of Bryan Austin's co-morbid illnesses, he is at least at moderate risk for complications without adequate follow up.  This format is felt to be most appropriate for this patient at this time.  The patient did not have access to video technology/had technical difficulties with video requiring transitioning to audio format only (telephone).  All issues noted in this document were discussed and addressed.  No physical exam could be performed with this format.  Please refer to the patient's chart for his consent to telehealth for [facility].  Evaluation Performed:  Preoperative cardiovascular risk assessment _____________   Date:  03/18/2023   Patient ID:  Bryan Austin, DOB Mar 08, 1961, MRN [id number] Patient Location:  Home Provider location:   Office  Primary Care Provider:  Ardyth Man, MD Primary Cardiologist:  Olga Millers, MD  Chief Complaint / Patient Profile   62 y.o. y/o male with a h/o bicuspid aortic valve, coronary CTA which was completed in November 2022 calcium score 49, mild plaque in the first diagonal with no other obstructive disease noted.  Echocardiogram December 2023 showed normal LV function, grade 2 diastolic dysfunction with mild aortic valve stenosis, hypertension, hyperlipidemia (intolerant to statins), now on PCSK9 inhibition,and  OSA followed by pulmonary.  He is pending left total knee partial arthroplasty by Dr. Ollen Gross, on 04/11/2023 and requests to hold aspirin prior to procedure.  And presents today for telephonic preoperative cardiovascular risk assessment.  History of Present Illness    Bryan Austin is a 62 y.o. male who presents via audio/video conferencing for a telehealth visit today.  Pt was last seen in cardiology clinic on 12/14/2022 follow-up high by Dr. Jens Som. At that time Bryan Austin was doing well and was without cardiac complaint..  The patient is now pending procedure  as outlined above. Since his last visit, he done well without any cardiac complaints.   Past Medical History    Past Medical History:  Diagnosis Date   Arthritis    Neck C3,C4, C5   Diabetes mellitus without complication (HCC)    GERD without esophagitis 2016   [facility]G CARDIAC EVALUATION [facility]   Gout    Hypertension    Past Surgical History:  Procedure Laterality Date   CHOLECYSTECTOMY  2006   GB FULL OF "SAND", NEEDED A DRAIN DUE TO INFECTION   COLONOSCOPY  [facility]   COLONOSCOPY N/A 10/14/2014   Procedure: COLONOSCOPY;  Surgeon: West Bali, MD;  Location[facility];  Service: Endoscopy;  Laterality: N/A;  100pm   COLONOSCOPY WITH PROPOFOL N/A 02/24/2021   Procedure: COLONOSCOPY WITH PROPOFOL;  Surgeon: Dolores Frame, MD;  Location: [facility];  Service: Gastroenterology;  Laterality: N/A;  8:35   ESOPHAGOGASTRODUODENOSCOPY N/A 10/14/2014   Procedure: ESOPHAGOGASTRODUODENOSCOPY (EGD);  Surgeon: West Bali, MD;  Location: [facility];  Service: Endoscopy;  Laterality: N/A;   HEMORRHOID BANDING N/A 10/14/2014   Procedure: HEMORRHOID BANDING;  Surgeon: West Bali, MD;  Location: [facility];  Service: Endoscopy;  Laterality: N/A;   HEMOSTASIS CLIP PLACEMENT  02/24/2021   Procedure: HEMOSTASIS CLIP PLACEMENT;  Surgeon: Dolores Frame, MD;  [facility]: [facility];  Service: Gastroenterology;;   HERNIA REPAIR  2006   COMPLICATED BY PROLONGED ILEUS   POLYPECTOMY  02/24/2021   Procedure: POLYPECTOMY;  Surgeon: Dolores Frame, MD;  Location: [facility];  Service: Gastroenterology;;    Allergies  Allergies  Allergen  Reactions   Statins Other (See Comments)    Increased LFTs   Adhesive [Tape] Itching and Rash   Bee Venom Rash    Home Medications    Prior to Admission medications   Medication Sig Start Date End Date Taking? Authorizing Provider  aspirin EC 81 MG tablet Take 1 tablet (81 mg total) by mouth daily. Swallow whole. 06/30/21    Lewayne Bunting, MD  AUVI-Q 0.3 MG/0.3ML SOAJ injection Inject 0.3 mg into the muscle as needed.    [provider]  Cholecalciferol (VITAMIN D3 PO) Take 1 capsule by mouth daily.    [provider]  Colchicine 0.6 MG CAPS Take 1 capsule every day by oral route as needed.    [provider]  Evolocumab (REPATHA SURECLICK) 140 MG/ML SOAJ Inject 140 mg into the skin every 14 (fourteen) days. 01/27/23   Lewayne Bunting, MD  furosemide (LASIX) 40 MG tablet Take 40 mg by mouth daily as needed for edema.    [provider]  irbesartan (AVAPRO) 150 MG tablet Take 1 tablet (150 mg total) by mouth daily. 01/12/22   Lewayne Bunting, MD  Melatonin 5 MG TABS Take 20 mg by mouth at bedtime.    [provider]  SYNJARDY XR 25-1000 MG TB24 Take 1 tablet by mouth daily. 12/28/22   [provider]    Physical Exam    Vital Signs:  Bryan Austin does not have vital signs available for review today.  Given telephonic nature of communication, physical exam is limited. AAOx3. NAD. Normal affect.  Speech and respirations are unlabored.  Accessory Clinical Findings    None  Assessment & Plan    1.  Preoperative Cardiovascular Risk Assessment:  The patient was advised that if he develops new symptoms prior to surgery to contact our office to arrange for a follow-up visit, and he verbalized understanding.  According to the Revised Cardiac Risk Index (RCRI), his Perioperative Risk of Major Cardiac Event is (%): 6.6  His Functional Capacity in METs is: 8.97 according to the [facility] Activity Status Index (DASI).   Per office protocol, if patient is without any new symptoms or concerns at the time of their virtual visit, he/she may hold ASA for 5-7 days prior to procedure. Please resume ASA  as soon as possible postprocedure, at the discretion of the surgeon.    A copy of this note will be routed to requesting surgeon.   Time:   Today, I have spent 10  minutes with the patient with telehealth technology discussing medical history, symptoms, and management plan.     Joni Reining, NP  03/18/2023, 10:34 AM

## 2023-03-18 ENCOUNTER — Ambulatory Visit: Payer: BC Managed Care – PPO | Attending: Cardiology | Admitting: Adult Health

## 2023-03-18 DIAGNOSIS — Z01818 Encounter for other preprocedural examination: Secondary | ICD-10-CM

## 2023-03-23 NOTE — Progress Notes (Addendum)
Anesthesia Review:  PCP: DR Meredith Mody - clearance 01/12/23 on chart  Cardiologist : Netta Corrigan preop eval on 03/18/23  Chest x-ray : EKG : 04/10/2022  Echo : 08/03/22  Ct cors- 06/29/21  Stress test: Cardiac Cath :  Activity level:  Sleep Study/ CPAP : Fasting Blood Sugar :      / Checks Blood Sugar -- times a day:   Blood Thinner/ Instructions /Last Dose: ASA / Instructions/ Last Dose :    DM- type  Hgba1c-  Hgba1c- 02/03/23- 6.4

## 2023-03-23 NOTE — Patient Instructions (Addendum)
SURGICAL WAITING ROOM VISITATION  Patients having surgery or a procedure may have no more than 2 support people in the waiting area - these visitors may rotate.    Children under the age of 20 must have an adult with them who is not the patient.  Due to an increase in RSV and influenza rates and associated hospitalizations, children ages 79 and under may not visit patients in [facility] hospitals.  If the patient needs to stay at the hospital during part of their recovery, the visitor guidelines for inpatient rooms apply. Pre-op nurse will coordinate an appropriate time for 1 support person to accompany patient in pre-op.  This support person may not rotate.    Please refer to the [facility] website for the visitor guidelines for Inpatients (after your surgery is over and you are in a regular room).       Your procedure is scheduled on:  01/09/2023    Report to [facility] Main Entrance    Report to admitting at  0515 AM   Call this number if you have problems the morning of surgery [phone]   Do not eat food :After Midnight.   After Midnight you may have the following liquids until _ 0415_____ AM DAY OF SURGERY  Water Non-Citrus Juices (without pulp, NO RED-Apple, White grape, White cranberry) Black Coffee (NO MILK/CREAM OR CREAMERS, sugar ok)  Clear Tea (NO MILK/CREAM OR CREAMERS, sugar ok) regular and decaf                             Plain Jell-O (NO RED)                                           Fruit ices (not with fruit pulp, NO RED)                                     Popsicles (NO RED)                                                               Sports drinks like Gatorade (NO RED)                    The day of surgery:  Drink ONE (1) Pre-Surgery Clear Ensure or G2 at  0415AM ( have completed by )  the morning of surgery. Drink in one sitting. Do not sip.  This drink was given to you during your hospital  pre-op appointment visit. Nothing else to  drink after completing the  Pre-Surgery Clear Ensure or G2.          If you have questions, please contact your surgeon's office.        Oral Hygiene is also important to reduce your risk of infection.                                    Remember - BRUSH YOUR TEETH THE MORNING OF SURGERY WITH  YOUR REGULAR TOOTHPASTE  DENTURES WILL BE REMOVED PRIOR TO SURGERY PLEASE DO NOT APPLY "Poly grip" OR ADHESIVES!!!   Do NOT smoke after Midnight   Stop all vitamins and herbal supplements 7 days before surgery.   Take these medicines the morning of surgery with A SIP OF WATER:  none              OZempic- last dose on              Synjardy-   DO NOT TAKE ANY ORAL DIABETIC MEDICATIONS DAY OF YOUR SURGERY  Bring CPAP mask and tubing day of surgery.                              You may not have any metal on your body including hair pins, jewelry, and body piercing             Do not wear make-up, lotions, powders, perfumes/cologne, or deodorant  Do not wear nail polish including gel and S&S, artificial/acrylic nails, or any other type of covering on natural nails including finger and toenails. If you have artificial nails, gel coating, etc. that needs to be removed by a nail salon please have this removed prior to surgery or surgery may need to be canceled/ delayed if the surgeon/ anesthesia feels like they are unable to be safely monitored.   Do not shave  48 hours prior to surgery.               Men may shave face and neck.   Do not bring valuables to the hospital. [facility] IS NOT             RESPONSIBLE   FOR VALUABLES.   Contacts, glasses, dentures or bridgework may not be worn into surgery.   Bring small overnight bag day of surgery.   DO NOT BRING YOUR HOME MEDICATIONS TO THE HOSPITAL. PHARMACY WILL DISPENSE MEDICATIONS LISTED ON YOUR MEDICATION LIST TO YOU DURING YOUR ADMISSION IN THE HOSPITAL!    Patients discharged on the day of surgery will not be allowed to drive home.   Someone NEEDS to stay with you for the first 24 hours after anesthesia.   Special Instructions: Bring a copy of your healthcare power of attorney and living will documents the day of surgery if you haven't scanned them before.              Please read over the following fact sheets you were given: IF YOU HAVE QUESTIONS ABOUT YOUR PRE-OP INSTRUCTIONS PLEASE CALL [phone]   If you received a COVID test during your pre-op visit  it is requested that you wear a mask when out in public, stay away from anyone that may not be feeling well and notify your surgeon if you develop symptoms. If you test positive for Covid or have been in contact with anyone that has tested positive in the last 10 days please notify you surgeon.      Pre-operative 5 CHG Bath Instructions   You can play a key role in reducing the risk of infection after surgery. Your skin needs to be as free of germs as possible. You can reduce the number of germs on your skin by washing with CHG (chlorhexidine gluconate) soap before surgery. CHG is an antiseptic soap that kills germs and continues to kill germs even after washing.   DO NOT use if you have an allergy to chlorhexidine/CHG  or antibacterial soaps. If your skin becomes reddened or irritated, stop using the CHG and notify one of our RNs at [phone].   Please shower with the CHG soap starting 4 days before surgery using the following schedule:     Please keep in mind the following:  DO NOT shave, including legs and underarms, starting the day of your first shower.   You may shave your face at any point before/day of surgery.  Place clean sheets on your bed the day you start using CHG soap. Use a clean washcloth (not used since being washed) for each shower. DO NOT sleep with pets once you start using the CHG.   CHG Shower Instructions:  If you choose to wash your hair and private area, wash first with your normal shampoo/soap.  After you use shampoo/soap, rinse  your hair and body thoroughly to remove shampoo/soap residue.  Turn the water OFF and apply about 3 tablespoons (45 ml) of CHG soap to a CLEAN washcloth.  Apply CHG soap ONLY FROM YOUR NECK DOWN TO YOUR TOES (washing for 3-5 minutes)  DO NOT use CHG soap on face, private areas, open wounds, or sores.  Pay special attention to the area where your surgery is being performed.  If you are having back surgery, having someone wash your back for you may be helpful. Wait 2 minutes after CHG soap is applied, then you may rinse off the CHG soap.  Pat dry with a clean towel  Put on clean clothes/pajamas   If you choose to wear lotion, please use ONLY the CHG-compatible lotions on the back of this paper.     Additional instructions for the day of surgery: DO NOT APPLY any lotions, deodorants, cologne, or perfumes.   Put on clean/comfortable clothes.  Brush your teeth.  Ask your nurse before applying any prescription medications to the skin.      CHG Compatible Lotions   Aveeno Moisturizing lotion  Cetaphil Moisturizing Cream  Cetaphil Moisturizing Lotion  Clairol Herbal Essence Moisturizing Lotion, Dry Skin  Clairol Herbal Essence Moisturizing Lotion, Extra Dry Skin  Clairol Herbal Essence Moisturizing Lotion, Normal Skin  Curel Age Defying Therapeutic Moisturizing Lotion with Alpha Hydroxy  Curel Extreme Care Body Lotion  Curel Soothing Hands Moisturizing Hand Lotion  Curel Therapeutic Moisturizing Cream, Fragrance-Free  Curel Therapeutic Moisturizing Lotion, Fragrance-Free  Curel Therapeutic Moisturizing Lotion, Original Formula  Eucerin Daily Replenishing Lotion  Eucerin Dry Skin Therapy Plus Alpha Hydroxy Crme  Eucerin Dry Skin Therapy Plus Alpha Hydroxy Lotion  Eucerin Original Crme  Eucerin Original Lotion  Eucerin Plus Crme Eucerin Plus Lotion  Eucerin TriLipid Replenishing Lotion  Keri Anti-Bacterial Hand Lotion  Keri Deep Conditioning Original Lotion Dry Skin Formula Softly  Scented  Keri Deep Conditioning Original Lotion, Fragrance Free Sensitive Skin Formula  Keri Lotion Fast Absorbing Fragrance Free Sensitive Skin Formula  Keri Lotion Fast Absorbing Softly Scented Dry Skin Formula  Keri Original Lotion  Keri Skin Renewal Lotion Keri Silky Smooth Lotion  Keri Silky Smooth Sensitive Skin Lotion  Nivea Body Creamy Conditioning Oil  Nivea Body Extra Enriched Teacher, adult education Moisturizing Lotion Nivea Crme  Nivea Skin Firming Lotion  NutraDerm 30 Skin Lotion  NutraDerm Skin Lotion  NutraDerm Therapeutic Skin Cream  NutraDerm Therapeutic Skin Lotion  ProShield Protective Hand Cream  Provon moisturizing lotion

## 2023-03-29 ENCOUNTER — Encounter (HOSPITAL_COMMUNITY): Payer: Self-pay

## 2023-03-29 ENCOUNTER — Other Ambulatory Visit: Payer: Self-pay

## 2023-03-29 ENCOUNTER — Encounter (HOSPITAL_COMMUNITY)
Admission: RE | Admit: 2023-03-29 | Discharge: 2023-03-29 | Disposition: A | Discharge status: Home / Self Care | Payer: BC Managed Care – PPO | Source: Ambulatory Visit | Attending: Orthopedic Surgery | Admitting: Orthopedic Surgery

## 2023-03-29 VITALS — BP 163/84 | HR 69 | Temp 98.2°F | Resp 16 | Ht 71.0 in | Wt 278.0 lb

## 2023-03-29 DIAGNOSIS — R9431 Abnormal electrocardiogram [ECG] [EKG]: Secondary | ICD-10-CM | POA: Insufficient documentation

## 2023-03-29 DIAGNOSIS — Z01818 Encounter for other preprocedural examination: Secondary | ICD-10-CM | POA: Insufficient documentation

## 2023-03-29 HISTORY — DX: Cardiac murmur, unspecified: R01.1

## 2023-03-29 HISTORY — DX: Sleep apnea, unspecified: G47.30

## 2023-03-29 HISTORY — DX: Other complications of anesthesia, initial encounter: T88.59XA

## 2023-03-29 LAB — CBC
HCT: 52 % (ref 39.0–52.0)
Hemoglobin: 16.7 g/dL (ref 13.0–17.0)
MCH: 29 pg (ref 26.0–34.0)
MCHC: 32.1 g/dL (ref 30.0–36.0)
MCV: 90.3 fL (ref 80.0–100.0)
Platelets: 217 10*3/uL (ref 150–400)
RBC: 5.76 MIL/uL (ref 4.22–5.81)
RDW: 13.7 % (ref 11.5–15.5)
WBC: 8.5 10*3/uL (ref 4.0–10.5)
nRBC: 0 % (ref 0.0–0.2)

## 2023-03-29 LAB — BASIC METABOLIC PANEL
Anion gap: 9 (ref 5–15)
BUN: 16 mg/dL (ref 8–23)
CO2: 26 mmol/L (ref 22–32)
Calcium: 9.6 mg/dL (ref 8.9–10.3)
Chloride: 105 mmol/L (ref 98–111)
Creatinine, Ser: 1.2 mg/dL (ref 0.61–1.24)
GFR, Estimated: >60 mL/min (ref >60)
Glucose, Bld: 116 mg/dL — ABNORMAL HIGH (ref 70–99)
Potassium: 4.5 mmol/L (ref 3.5–5.1)
Sodium: 140 mmol/L (ref 135–145)

## 2023-03-29 LAB — SURGICAL PCR SCREEN
MRSA, PCR: NEGATIVE
Staphylococcus aureus: NEGATIVE

## 2023-03-29 LAB — HEMOGLOBIN A1C
Hgb A1c MFr Bld: 6.2 % — ABNORMAL HIGH (ref 4.8–5.6)
Mean Plasma Glucose: 131.24 mg/dL

## 2023-03-29 LAB — GLUCOSE, CAPILLARY: Glucose-Capillary: 99 mg/dL (ref 70–99)

## 2023-03-30 NOTE — H&P (Signed)
ADMISSION H&P  Patient is being admitted for left unicompartmental vs total knee arthroplasty.  Subjective:  Chief Complaint: Left knee pain.  HPI: Bryan Austin, 62 y.o. male has a history of pain and functional disability in the left knee due to arthritis and has failed non-surgical conservative treatments for greater than 12 weeks to include corticosteriod injections, viscosupplementation injections, and activity modification. Onset of symptoms was gradual, starting 5 years ago with gradually worsening course since that time. The patient noted no past surgery on the left knee.  Patient currently rates pain in the left knee at 8 out of 10 with activity. Patient has worsening of pain with activity and weight bearing, pain that interferes with activities of daily living, and pain with passive range of motion. Patient has evidence of subchondral cysts, periarticular osteophytes, and joint space narrowing by imaging studies. There is no active infection.  Patient Active Problem List   Diagnosis Date Noted   Agatston CAC score, <100 01/26/2023   Coronary artery disease involving native coronary artery of native heart without angina pectoris 01/26/2023   Diabetes mellitus without complication (HCC) 01/26/2023   Osteoarthritis of left knee 12/04/2022   Osteoarthritis of right knee 12/04/2022   Hyperlipidemia 03/15/2022   External hemorrhoids 12/09/2020   History of colonic polyps 12/09/2020   Dislocation of proximal interphalangeal joint of left middle finger 06/12/2019   Diastolic CHF, chronic (HCC) 04/10/2018   Chest pain 04/10/2018   Morbid obesity (HCC) 04/10/2018   Essential hypertension 04/05/2018   Aortic valve disorder 04/05/2018   Angina pectoris (HCC) 09/06/2014   Nonspecific abnormal results of liver function study 01/29/2004    Past Medical History:  Diagnosis Date   Arthritis    Neck C3,C4, C5   Complication of anesthesia    wakes up during procedures per pt   Diabetes  mellitus without complication (HCC)    GERD without esophagitis 2016   NEG CARDIAC EVALUATION [facility]   Gout    Heart murmur    Hypertension    Sleep apnea    no cpap , no mouth device    Past Surgical History:  Procedure Laterality Date   CHOLECYSTECTOMY  08/23/2004   GB FULL OF "SAND", NEEDED A DRAIN DUE TO INFECTION   COLONOSCOPY  [facility]   COLONOSCOPY N/A 10/14/2014   Procedure: COLONOSCOPY;  Surgeon: West Bali, MD;  [facility];  Service: Endoscopy;  Laterality: N/A;  100pm   COLONOSCOPY WITH PROPOFOL N/A 02/24/2021   Procedure: COLONOSCOPY WITH PROPOFOL;  Surgeon: Dolores Frame, MD;  [facility];  Service: Gastroenterology;  Laterality: N/A;  8:35   ESOPHAGOGASTRODUODENOSCOPY N/A 10/14/2014   Procedure: ESOPHAGOGASTRODUODENOSCOPY (EGD);  Surgeon: West Bali, MD;  [facility];  Service: Endoscopy;  Laterality: N/A;   HEMORRHOID BANDING N/A 10/14/2014   Procedure: HEMORRHOID BANDING;  Surgeon: West Bali, MD;  [facility];  Service: Endoscopy;  Laterality: N/A;   HEMOSTASIS CLIP PLACEMENT  02/24/2021   Procedure: HEMOSTASIS CLIP PLACEMENT;  Surgeon: Dolores Frame, MD;  [facility];  Service: Gastroenterology;;   HERNIA REPAIR  08/23/2004   COMPLICATED BY PROLONGED ILEUS   POLYPECTOMY  02/24/2021   Procedure: POLYPECTOMY;  Surgeon: Dolores Frame, MD;  [facility];  Service: Gastroenterology;;   urinary tract surgery      age 69    Prior to Admission medications   Medication Sig Start Date End Date Taking? Authorizing Provider  acetaminophen (  TYLENOL) 500 MG tablet Take 500-1,000 mg by mouth every 6 (six) hours as needed (pain.).   Yes [provider]  aspirin EC 81 MG tablet Take 1 tablet (81 mg total) by mouth daily. Swallow whole. 06/30/21  Yes Lewayne Bunting, MD  Cholecalciferol (VITAMIN D3 PO) Take 4,000 Units by mouth in the morning.   Yes [provider]  Coenzyme Q10-Vitamin E (QUNOL ULTRA COQ10 PO) Take 2 capsules by mouth in the morning and at bedtime.   Yes [provider]  Colchicine 0.6 MG CAPS Take 0.6-1.2 mg by mouth daily as needed (gout flares).   Yes [provider]  furosemide (LASIX) 40 MG tablet Take 40 mg by mouth daily as needed for edema.   Yes [provider]  irbesartan (AVAPRO) 150 MG tablet Take 1 tablet (150 mg total) by mouth daily. 01/12/22  Yes Lewayne Bunting, MD  Melatonin 10 MG TABS Take 20 mg by mouth at bedtime.   Yes [provider]  Misc Natural Products (CHOLESTEROL SUPPORT PO) Take 3 capsules by mouth in the morning. [employer] Cholesterol Support   Yes [provider]  NON FORMULARY Inject 2 Syringes as directed once a week. 2 different types of Allergy shots   Yes [provider]  OZEMPIC, 0.25 OR 0.5 MG/DOSE, 2 MG/3ML SOPN Inject 0.25 mg into the skin every Friday. 12/28/22  Yes [provider]  senna (SENOKOT) 8.6 MG tablet Take 2 tablets by mouth 2 (two) times daily.   Yes [provider]  SYNJARDY XR 25-1000 MG TB24 Take 1 tablet by mouth in the morning. 12/28/22  Yes [provider]  AUVI-Q 0.3 MG/0.3ML SOAJ injection Inject 0.3 mg into the muscle as needed for anaphylaxis.    [provider]  Evolocumab (REPATHA SURECLICK) 140 MG/ML SOAJ Inject 140 mg into the skin every 14 (fourteen) days. Patient not taking: Reported on 03/23/2023 01/27/23   Lewayne Bunting, MD  VTAMA 1 % CREA Apply 1 Application topically daily as needed (psoriasis.). 02/23/23   [provider]    Allergies  Allergen Reactions   Statins Other (See Comments)    Increased LFTs   Adhesive [Tape] Itching and Rash   Bee Venom Rash    Social History   Socioeconomic History   Marital status: Married    Spouse name: Not on file   Number of children: Not on file   Years of education: Not on file   Highest education level:  Not on file  Occupational History   Not on file  Tobacco Use   Smoking status: Never   Smokeless tobacco: Never  Vaping Use   Vaping status: Never Used  Substance and Sexual Activity   Alcohol use: Never    Alcohol/week: 0.0 standard drinks of alcohol   Drug use: Never   Sexual activity: Yes  Other Topics Concern   Not on file  Social History Narrative   Part owner OF [employer]   Social Determinants of Health   Financial Resource Strain: Not on file  Food Insecurity: Not on file  Transportation Needs: Not on file  Physical Activity: Not on file  Stress: Not on file  Social Connections: Unknown (11/05/2022)   Received from [facility]   Social Network    Social Network: Not on file  Intimate Partner Violence: Unknown (11/05/2022)   Received from [facility]   HITS    Physically Hurt: Not on file  Insult or Talk Down To: Not on file    Threaten Physical Harm: Not on file    Scream or Curse: Not on file    Tobacco Use: Low Risk  (03/29/2023)   Patient History    Smoking Tobacco Use: Never    Smokeless Tobacco Use: Never    Passive Exposure: Not on file   Social History   Substance and Sexual Activity  Alcohol Use Never   Alcohol/week: 0.0 standard drinks of alcohol    Family History  Problem Relation Age of Onset   Cancer Mother    Colon polyps Mother    Kidney disease Mother    Heart failure Father    Diabetes Father    Colon cancer Maternal Grandmother     ROS  Objective:  Physical Exam: - Well-developed male alert and oriented in no apparent distress    - Left knee shows a trace effusion with a range of 0 to 125. There is significant medial tenderness, with no lateral tenderness or instability noted.    - Right knee shows no effusion and a varus deformity. The range is 5 to 125. There is medial tenderness, with no lateral tenderness or instability.    IMAGING:    Radiographs from March show isolated  medial compartment arthritis in the left knee, with no lateral arthritis. The patellofemoral joint shows a small cyst in the patella but no narrowing. The right knee demonstrates bone-on-bone medial arthritis with lateral marginal osteophytes and some patellofemoral osteophytes. There is also slight lateral subluxation of the tibia.   Assessment/Plan:  End stage arthritis, left knee   The patient history, physical examination, clinical judgment of the provider and imaging studies are consistent with end stage degenerative joint disease of the left knee and knee arthroplasty is deemed medically necessary. The treatment options including medical management, injection therapy arthroscopy and total vs. Unicompartmental arthroplasty were discussed at length. The risks and benefits of total and unicompartmental knee arthroplasty were presented and reviewed. The risks due to aseptic loosening, infection, stiffness, patella tracking problems, thromboembolic complications and other imponderables were discussed. It was also discussed that patient may require a total knee arthroplasty pending intra-operative examination of chondral surfaces during surgery. The patient acknowledged the explanation, agreed to proceed with the plan and consent was signed. Patient is being admitted for inpatient treatment for surgery, pain control, PT, OT, prophylactic antibiotics, VTE prophylaxis, progressive ambulation and ADLs and discharge planning. The patient is planning to be discharged home.   Patient's anticipated LOS is less than 2 midnights, meeting these requirements: - Younger than 25 - Lives within 1 hour of care - Has a competent adult at home to recover with post-op recover - NO history of  - Chronic pain requiring opiods  - Diabetes  - Coronary Artery Disease  - Heart failure  - Heart attack  - Stroke  - DVT/VTE  - Cardiac arrhythmia  - Respiratory Failure/COPD  - Renal failure  - Anemia  - Advanced Liver  disease  Therapy Plans: [facility] in [place] Disposition: Home with mother and father in law. Son will transport to PT Planned DVT Prophylaxis: Aspirin DME Needed: Dan Humphreys PCP: Meredith Mody, MD (clearance received) Cardiologist: Olga Millers, MD (clearance received) TXA: IV Allergies: NKDA Anesthesia Concerns: Has woken up during colonoscopy x 2. Metabolizes local anesthetics quickly. BMI: 39.2 Last HgbA1c: 6.2% on 03/29/23 Pharmacy: [facility]  Other: -Hold ASA 5-7 days prior to surgery per cardiology  - Patient was instructed on what  medications to stop prior to surgery. - Follow-up visit in 2 weeks with Dr. Lequita Halt - Begin physical therapy following surgery - Pre-operative lab work as pre-surgical testing - Prescriptions will be provided in hospital at time of discharge  Weston Brass, PA-C Orthopedic Surgery [facility] [place]

## 2023-04-04 NOTE — Anesthesia Preprocedure Evaluation (Addendum)
Anesthesia Evaluation  Patient identified by MRN, date of birth, ID band Patient awake    Reviewed: Allergy & Precautions, NPO status , Patient's Chart, lab work & pertinent test results  Airway Mallampati: III  TM Distance: >3 FB Neck ROM: Full    Dental  (+) Teeth Intact, Dental Advisory Given   Pulmonary sleep apnea    breath sounds clear to auscultation       Cardiovascular hypertension, Pt. on medications + CAD and +CHF  + Valvular Problems/Murmurs  Rhythm:Regular Rate:Normal + Systolic murmurs Echo: 1. Left ventricular ejection fraction, by estimation, is 60 to 65%. The  left ventricle has normal function. The left ventricle has no regional  wall motion abnormalities. Left ventricular diastolic parameters are  consistent with Grade II diastolic  dysfunction (pseudonormalization). Elevated left ventricular end-diastolic  pressure.   2. Right ventricular systolic function is normal. The right ventricular  size is normal.   3. The mitral valve is normal in structure. Trivial mitral valve  regurgitation. No evidence of mitral stenosis.   4. The aortic valve is calcified. There is mild calcification of the  aortic valve. There is mild thickening of the aortic valve. Aortic valve  regurgitation is not visualized. Mild aortic valve stenosis. Aortic valve  area, by VTI measures 1.37 cm.  Aortic valve mean gradient measures 11.0 mmHg. Aortic valve Vmax measures  2.28 m/s.   5. Aortic dilatation noted. There is mild dilatation of the ascending  aorta, measuring 36 mm.   6. The inferior vena cava is normal in size with <50% respiratory  variability, suggesting right atrial pressure of 8 mmHg.     Neuro/Psych negative neurological ROS  negative psych ROS   GI/Hepatic Neg liver ROS,GERD  ,,  Endo/Other  diabetes    Renal/GU negative Renal ROS     Musculoskeletal  (+) Arthritis ,    Abdominal  (+) + obese  Peds   Hematology negative hematology ROS (+)   Anesthesia Other Findings   Reproductive/Obstetrics                             Anesthesia Physical Anesthesia Plan  ASA: 3  Anesthesia Plan: Spinal   Post-op Pain Management: Regional block*   Induction: Intravenous  PONV Risk Score and Plan: 2 and Ondansetron, Propofol infusion and Midazolam  Airway Management Planned: Nasal Cannula and Natural Airway  Additional Equipment: None  Intra-op Plan:   Post-operative Plan:   Informed Consent: I have reviewed the patients History and Physical, chart, labs and discussed the procedure including the risks, benefits and alternatives for the proposed anesthesia with the patient or authorized representative who has indicated his/her understanding and acceptance.       Plan Discussed with: CRNA  Anesthesia Plan Comments: (See PAT note 03/29/2023  Lab Results      Component                Value               Date                      WBC                      8.5                 03/29/2023  HGB                      16.7                03/29/2023                HCT                      52.0                03/29/2023                MCV                      90.3                03/29/2023                PLT                      217                 03/29/2023            )       Anesthesia Quick Evaluation

## 2023-04-04 NOTE — Progress Notes (Signed)
Anesthesia Chart Review   Case: [id number] Date/Time: 04/11/23 0700   Procedure: LEFT KNEE MEDIAL UNICOMPARTMENTAL ARTHROPLASTY VS TOTAL KNEE ARTHROPLASTY (Left: Knee)   Anesthesia type: Choice   Pre-op diagnosis: left knee osteoarthritis   [facility] 09 / [facility]   Surgeons: Ollen Gross, MD       DISCUSSION:62 y.o. never smoker with h/o HTN, DM II, sleep apnea, mild AS, left knee OA scheduled for above procedure 04/11/2023 with Dr. Ollen Gross.   Per cardiology preoperative evaluation 03/18/2023, "According to the Revised Cardiac Risk Index (RCRI), his Perioperative Risk of Major Cardiac Event is (%): 6.6   His Functional Capacity in METs is: 8.97 according to the [facility] Activity Status Index (DASI).    Per office protocol, if patient is without any new symptoms or concerns at the time of their virtual visit, he/she may hold ASA for 5-7 days prior to procedure. Please resume ASA  as soon as possible postprocedure, at the discretion of the surgeon."  VS: BP (!) 163/84   Pulse 69   Temp 36.8 C (Oral)   Resp 16   Ht 5\' 11"  (1.803 m)   Wt 126.1 kg   SpO2 97%   BMI 38.77 kg/m   PROVIDERS: Park, Nettie Elm, MD is PCP   Cardiologist : Dr. Olga Millers  LABS: Labs reviewed: Acceptable for surgery. (all labs ordered are listed, but only abnormal results are displayed)  Labs Reviewed  HEMOGLOBIN A1C - Abnormal; Notable for the following components:      Result Value   Hgb A1c MFr Bld 6.2 (*)    All other components within normal limits  BASIC METABOLIC PANEL - Abnormal; Notable for the following components:   Glucose, Bld 116 (*)    All other components within normal limits  SURGICAL PCR SCREEN  CBC  GLUCOSE, CAPILLARY     IMAGES:   EKG:   CV: Echo 08/03/2022 1. Left ventricular ejection fraction, by estimation, is 60 to 65%. The  left ventricle has normal function. The left ventricle has no regional  wall motion abnormalities. Left ventricular diastolic  parameters are  consistent with Grade II diastolic  dysfunction (pseudonormalization). Elevated left ventricular end-diastolic  pressure.   2. Right ventricular systolic function is normal. The right ventricular  size is normal.   3. The mitral valve is normal in structure. Trivial mitral valve  regurgitation. No evidence of mitral stenosis.   4. The aortic valve is calcified. There is mild calcification of the  aortic valve. There is mild thickening of the aortic valve. Aortic valve  regurgitation is not visualized. Mild aortic valve stenosis. Aortic valve  area, by VTI measures 1.37 cm.  Aortic valve mean gradient measures 11.0 mmHg. Aortic valve Vmax measures  2.28 m/s.   5. Aortic dilatation noted. There is mild dilatation of the ascending  aorta, measuring 36 mm.   6. The inferior vena cava is normal in size with <50% respiratory  variability, suggesting right atrial pressure of 8 mmHg.   Past Medical History:  Diagnosis Date   Arthritis    Neck C3,C4, C5   Complication of anesthesia    wakes up during procedures per pt   Diabetes mellitus without complication (HCC)    GERD without esophagitis 2016   NEG CARDIAC EVALUATION [facility]   Gout    Heart murmur    Hypertension    Sleep apnea    no cpap , no mouth device    Past Surgical History:  Procedure Laterality Date   CHOLECYSTECTOMY  08/23/2004   GB FULL OF "SAND", NEEDED A DRAIN DUE TO INFECTION   COLONOSCOPY  [facility]   COLONOSCOPY N/A 10/14/2014   Procedure: COLONOSCOPY;  Surgeon: West Bali, MD;  [facility];  Service: Endoscopy;  Laterality: N/A;  100pm   COLONOSCOPY WITH PROPOFOL N/A 02/24/2021   Procedure: COLONOSCOPY WITH PROPOFOL;  Surgeon: Dolores Frame, MD;  [facility];  Service: Gastroenterology;  Laterality: N/A;  8:35   ESOPHAGOGASTRODUODENOSCOPY N/A 10/14/2014   Procedure: ESOPHAGOGASTRODUODENOSCOPY (EGD);  Surgeon: West Bali, MD;  [facility];   Service: Endoscopy;  Laterality: N/A;   HEMORRHOID BANDING N/A 10/14/2014   Procedure: HEMORRHOID BANDING;  Surgeon: West Bali, MD;  [facility];  Service: Endoscopy;  Laterality: N/A;   HEMOSTASIS CLIP PLACEMENT  02/24/2021   Procedure: HEMOSTASIS CLIP PLACEMENT;  Surgeon: Marguerita Merles, Reuel Boom, MD;  [facility];  Service: Gastroenterology;;   HERNIA REPAIR  08/23/2004   COMPLICATED BY PROLONGED ILEUS   POLYPECTOMY  02/24/2021   Procedure: POLYPECTOMY;  Surgeon: Marguerita Merles, Reuel Boom, MD;  [facility];  Service: Gastroenterology;;   urinary tract surgery      age 88    MEDICATIONS:  acetaminophen (TYLENOL) 500 MG tablet   aspirin EC 81 MG tablet   AUVI-Q 0.3 MG/0.3ML SOAJ injection   Cholecalciferol (VITAMIN D3 PO)   Coenzyme Q10-Vitamin E (QUNOL ULTRA COQ10 PO)   Colchicine 0.6 MG CAPS   Evolocumab (REPATHA SURECLICK) 140 MG/ML SOAJ   furosemide (LASIX) 40 MG tablet   irbesartan (AVAPRO) 150 MG tablet   Melatonin 10 MG TABS   Misc Natural Products (CHOLESTEROL SUPPORT PO)   NON FORMULARY   OZEMPIC, 0.25 OR 0.5 MG/DOSE, 2 MG/3ML SOPN   senna (SENOKOT) 8.6 MG tablet   SYNJARDY XR 25-1000 MG TB24   VTAMA 1 % CREA   No current facility-administered medications for this encounter.     Jodell Cipro Ward, PA-C [facility] Pre-Surgical Testing [phone]

## 2023-04-11 ENCOUNTER — Encounter (HOSPITAL_COMMUNITY): Payer: Self-pay | Admitting: Orthopedic Surgery

## 2023-04-11 ENCOUNTER — Encounter (HOSPITAL_COMMUNITY)
Admission: RE | Disposition: A | Discharge status: Home / Self Care | Payer: Self-pay | Source: Ambulatory Visit | Attending: Orthopedic Surgery

## 2023-04-11 ENCOUNTER — Ambulatory Visit (HOSPITAL_COMMUNITY): Payer: BC Managed Care – PPO | Admitting: Physician Assistant

## 2023-04-11 ENCOUNTER — Other Ambulatory Visit: Payer: Self-pay

## 2023-04-11 ENCOUNTER — Ambulatory Visit (HOSPITAL_COMMUNITY)
Admission: RE | Admit: 2023-04-11 | Discharge: 2023-04-11 | Disposition: A | Discharge status: Home / Self Care | Payer: BC Managed Care – PPO | Source: Ambulatory Visit | Attending: Orthopedic Surgery | Admitting: Orthopedic Surgery

## 2023-04-11 ENCOUNTER — Ambulatory Visit (HOSPITAL_COMMUNITY): Payer: BC Managed Care – PPO

## 2023-04-11 DIAGNOSIS — I2510 Atherosclerotic heart disease of native coronary artery without angina pectoris: Secondary | ICD-10-CM | POA: Diagnosis not present

## 2023-04-11 DIAGNOSIS — M25762 Osteophyte, left knee: Secondary | ICD-10-CM | POA: Insufficient documentation

## 2023-04-11 DIAGNOSIS — Z7985 Long-term (current) use of injectable non-insulin antidiabetic drugs: Secondary | ICD-10-CM | POA: Diagnosis not present

## 2023-04-11 DIAGNOSIS — I110 Hypertensive heart disease with heart failure: Secondary | ICD-10-CM | POA: Diagnosis not present

## 2023-04-11 DIAGNOSIS — Z7984 Long term (current) use of oral hypoglycemic drugs: Secondary | ICD-10-CM | POA: Insufficient documentation

## 2023-04-11 DIAGNOSIS — M94262 Chondromalacia, left knee: Secondary | ICD-10-CM | POA: Diagnosis not present

## 2023-04-11 DIAGNOSIS — Z01818 Encounter for other preprocedural examination: Secondary | ICD-10-CM

## 2023-04-11 DIAGNOSIS — M1712 Unilateral primary osteoarthritis, left knee: Secondary | ICD-10-CM | POA: Diagnosis present

## 2023-04-11 DIAGNOSIS — Z833 Family history of diabetes mellitus: Secondary | ICD-10-CM | POA: Diagnosis not present

## 2023-04-11 DIAGNOSIS — E119 Type 2 diabetes mellitus without complications: Secondary | ICD-10-CM | POA: Diagnosis not present

## 2023-04-11 DIAGNOSIS — G4730 Sleep apnea, unspecified: Secondary | ICD-10-CM | POA: Diagnosis not present

## 2023-04-11 DIAGNOSIS — I5032 Chronic diastolic (congestive) heart failure: Secondary | ICD-10-CM | POA: Diagnosis not present

## 2023-04-11 DIAGNOSIS — Z8249 Family history of ischemic heart disease and other diseases of the circulatory system: Secondary | ICD-10-CM | POA: Insufficient documentation

## 2023-04-11 HISTORY — PX: PARTIAL KNEE ARTHROPLASTY: SHX2174

## 2023-04-11 LAB — GLUCOSE, CAPILLARY
Glucose-Capillary: 128 mg/dL — ABNORMAL HIGH (ref 70–99)
Glucose-Capillary: 115 mg/dL — ABNORMAL HIGH (ref 70–99)

## 2023-04-11 SURGERY — ARTHROPLASTY, KNEE, UNICOMPARTMENTAL
Anesthesia: Spinal | Site: Knee | Laterality: Left

## 2023-04-11 MED ORDER — 0.9 % SODIUM CHLORIDE (POUR BTL) OPTIME
TOPICAL | Status: DC | PRN
  Administered 2023-04-11: 1000 mL

## 2023-04-11 MED ORDER — LACTATED RINGERS IV BOLUS: 250.0000 mL | Freq: Once | INTRAVENOUS | Status: DC

## 2023-04-11 MED ORDER — SODIUM CHLORIDE 0.9 % IR SOLN
Status: DC | PRN
  Administered 2023-04-11: 1000 mL

## 2023-04-11 MED ORDER — ASPIRIN 81 MG PO TBEC: 81.0000 mg | DELAYED_RELEASE_TABLET | Freq: Every day | ORAL | 0 refills | Status: AC

## 2023-04-11 MED ORDER — FENTANYL CITRATE (PF) 100 MCG/2ML IJ SOLN
INTRAMUSCULAR | Status: DC | PRN
  Administered 2023-04-11 (×2): 50 ug via INTRAVENOUS

## 2023-04-11 MED ORDER — TRAMADOL HCL 50 MG PO TABS
50.0000 mg | ORAL_TABLET | Freq: Four times a day (QID) | ORAL | 0 refills | Status: DC | PRN
Start: 2023-04-11 — End: 2023-10-05

## 2023-04-11 MED ORDER — LACTATED RINGERS IV SOLN: INTRAVENOUS | Status: DC

## 2023-04-11 MED ORDER — SODIUM CHLORIDE (PF) 0.9 % IJ SOLN
INTRAMUSCULAR | Status: AC
  Filled 2023-04-11: qty 50

## 2023-04-11 MED ORDER — PHENYLEPHRINE 80 MCG/ML (10ML) SYRINGE FOR IV PUSH (FOR BLOOD PRESSURE SUPPORT)
PREFILLED_SYRINGE | INTRAVENOUS | Status: AC
  Filled 2023-04-11: qty 10

## 2023-04-11 MED ORDER — CEFAZOLIN SODIUM-DEXTROSE 2-4 GM/100ML-% IV SOLN: 2.0000 g | Freq: Four times a day (QID) | INTRAVENOUS | Status: DC

## 2023-04-11 MED ORDER — ACETAMINOPHEN 10 MG/ML IV SOLN
1000.0000 mg | Freq: Four times a day (QID) | INTRAVENOUS | Status: DC
  Administered 2023-04-11: 1000 mg via INTRAVENOUS
  Filled 2023-04-11: qty 100

## 2023-04-11 MED ORDER — DROPERIDOL 2.5 MG/ML IJ SOLN: 0.6250 mg | Freq: Once | INTRAMUSCULAR | Status: DC | PRN

## 2023-04-11 MED ORDER — ACETAMINOPHEN 10 MG/ML IV SOLN: 1000.0000 mg | Freq: Once | INTRAVENOUS | Status: DC | PRN

## 2023-04-11 MED ORDER — STERILE WATER FOR IRRIGATION IR SOLN
Status: DC | PRN
  Administered 2023-04-11: 2000 mL

## 2023-04-11 MED ORDER — CEFAZOLIN IN SODIUM CHLORIDE 3-0.9 GM/100ML-% IV SOLN
3.0000 g | INTRAVENOUS | Status: AC
  Administered 2023-04-11: 3 g via INTRAVENOUS
  Filled 2023-04-11: qty 100

## 2023-04-11 MED ORDER — DEXAMETHASONE SODIUM PHOSPHATE 10 MG/ML IJ SOLN: 8.0000 mg | Freq: Once | INTRAMUSCULAR | Status: DC

## 2023-04-11 MED ORDER — METHOCARBAMOL 500 MG PO TABS: 500.0000 mg | ORAL_TABLET | Freq: Four times a day (QID) | ORAL | Status: DC | PRN

## 2023-04-11 MED ORDER — PROPOFOL 1000 MG/100ML IV EMUL
INTRAVENOUS | Status: AC
  Filled 2023-04-11: qty 100

## 2023-04-11 MED ORDER — HYDROMORPHONE HCL 1 MG/ML IJ SOLN: 0.2500 mg | INTRAMUSCULAR | Status: DC | PRN

## 2023-04-11 MED ORDER — TRANEXAMIC ACID-NACL 1000-0.7 MG/100ML-% IV SOLN
1000.0000 mg | INTRAVENOUS | Status: AC
  Administered 2023-04-11: 1000 mg via INTRAVENOUS
  Filled 2023-04-11: qty 100

## 2023-04-11 MED ORDER — METHOCARBAMOL 500 MG PO TABS: 500.0000 mg | ORAL_TABLET | Freq: Four times a day (QID) | ORAL | 0 refills | Status: DC | PRN

## 2023-04-11 MED ORDER — PHENYLEPHRINE HCL (PRESSORS) 10 MG/ML IV SOLN
INTRAVENOUS | Status: DC | PRN
  Administered 2023-04-11: 80 ug via INTRAVENOUS
  Administered 2023-04-11: 100 ug via INTRAVENOUS
  Administered 2023-04-11 (×2): 80 ug via INTRAVENOUS
  Administered 2023-04-11: 160 ug via INTRAVENOUS
  Administered 2023-04-11: 80 ug via INTRAVENOUS
  Administered 2023-04-11: 160 ug via INTRAVENOUS

## 2023-04-11 MED ORDER — PROPOFOL 500 MG/50ML IV EMUL
INTRAVENOUS | Status: DC | PRN
  Administered 2023-04-11: 25 ug/kg/min via INTRAVENOUS

## 2023-04-11 MED ORDER — MIDAZOLAM HCL 5 MG/5ML IJ SOLN
INTRAMUSCULAR | Status: DC | PRN
  Administered 2023-04-11 (×2): 1 mg via INTRAVENOUS

## 2023-04-11 MED ORDER — ONDANSETRON HCL 4 MG/2ML IJ SOLN
INTRAMUSCULAR | Status: AC
  Filled 2023-04-11: qty 2

## 2023-04-11 MED ORDER — BUPIVACAINE LIPOSOME 1.3 % IJ SUSP
INTRAMUSCULAR | Status: DC | PRN
  Administered 2023-04-11: 20 mL

## 2023-04-11 MED ORDER — FENTANYL CITRATE (PF) 100 MCG/2ML IJ SOLN
INTRAMUSCULAR | Status: AC
  Filled 2023-04-11: qty 2

## 2023-04-11 MED ORDER — ONDANSETRON HCL 4 MG/2ML IJ SOLN
INTRAMUSCULAR | Status: DC | PRN
  Administered 2023-04-11: 4 mg via INTRAVENOUS

## 2023-04-11 MED ORDER — OXYCODONE HCL 5 MG PO TABS: 5.0000 mg | ORAL_TABLET | Freq: Four times a day (QID) | ORAL | 0 refills | Status: DC | PRN

## 2023-04-11 MED ORDER — ROPIVACAINE HCL 5 MG/ML IJ SOLN
INTRAMUSCULAR | Status: DC | PRN
  Administered 2023-04-11: 30 mL via PERINEURAL

## 2023-04-11 MED ORDER — SODIUM CHLORIDE 0.9% FLUSH
INTRAVENOUS | Status: DC | PRN
  Administered 2023-04-11: 30 mL

## 2023-04-11 MED ORDER — EPHEDRINE 5 MG/ML INJ
INTRAVENOUS | Status: AC
  Filled 2023-04-11: qty 5

## 2023-04-11 MED ORDER — BUPIVACAINE LIPOSOME 1.3 % IJ SUSP
INTRAMUSCULAR | Status: AC
  Filled 2023-04-11: qty 20

## 2023-04-11 MED ORDER — INSULIN ASPART 100 UNIT/ML IJ SOLN: 0.0000 [IU] | INTRAMUSCULAR | Status: DC | PRN

## 2023-04-11 MED ORDER — ORAL CARE MOUTH RINSE: 15.0000 mL | Freq: Once | OROMUCOSAL | Status: AC

## 2023-04-11 MED ORDER — MEPERIDINE HCL 50 MG/ML IJ SOLN: 6.2500 mg | INTRAMUSCULAR | Status: DC | PRN

## 2023-04-11 MED ORDER — ACETAMINOPHEN 325 MG PO TABS: 325.0000 mg | ORAL_TABLET | Freq: Once | ORAL | Status: DC | PRN

## 2023-04-11 MED ORDER — MIDAZOLAM HCL 2 MG/2ML IJ SOLN
INTRAMUSCULAR | Status: AC
  Filled 2023-04-11: qty 2

## 2023-04-11 MED ORDER — CHLORHEXIDINE GLUCONATE 0.12 % MT SOLN
15.0000 mL | Freq: Once | OROMUCOSAL | Status: AC
  Administered 2023-04-11: 15 mL via OROMUCOSAL

## 2023-04-11 MED ORDER — METHOCARBAMOL 500 MG IVPB - SIMPLE MED: 500.0000 mg | Freq: Four times a day (QID) | INTRAVENOUS | Status: DC | PRN

## 2023-04-11 MED ORDER — BUPIVACAINE IN DEXTROSE 0.75-8.25 % IT SOLN
INTRATHECAL | Status: DC | PRN
  Administered 2023-04-11: 2 mL via INTRATHECAL

## 2023-04-11 MED ORDER — BUPIVACAINE LIPOSOME 1.3 % IJ SUSP: 20.0000 mL | Freq: Once | INTRAMUSCULAR | Status: DC

## 2023-04-11 MED ORDER — LACTATED RINGERS IV BOLUS
500.0000 mL | Freq: Once | INTRAVENOUS | Status: AC
  Administered 2023-04-11: 500 mL via INTRAVENOUS

## 2023-04-11 MED ORDER — POVIDONE-IODINE 10 % EX SWAB: 2.0000 | Freq: Once | CUTANEOUS | Status: DC

## 2023-04-11 MED ORDER — ACETAMINOPHEN 160 MG/5ML PO SOLN: 325.0000 mg | Freq: Once | ORAL | Status: DC | PRN

## 2023-04-11 MED ORDER — EPHEDRINE SULFATE (PRESSORS) 50 MG/ML IJ SOLN
INTRAMUSCULAR | Status: DC | PRN
  Administered 2023-04-11: 10 mg via INTRAVENOUS
  Administered 2023-04-11: 5 mg via INTRAVENOUS

## 2023-04-11 MED ORDER — ACETAMINOPHEN 500 MG PO TABS: 1000.0000 mg | ORAL_TABLET | Freq: Once | ORAL | Status: DC

## 2023-04-11 MED ORDER — PROMETHAZINE HCL 25 MG/ML IJ SOLN: 6.2500 mg | INTRAMUSCULAR | Status: DC | PRN

## 2023-04-11 SURGICAL SUPPLY — 56 items
ADH SKN CLS APL DERMABOND .7 (GAUZE/BANDAGES/DRESSINGS) ×1
BLADE SAW RECIPROCATING 77.5 (BLADE) ×1 IMPLANT
BLADE SAW SGTL 11.0X1.19X90.0M (BLADE) ×1 IMPLANT
BNDG CMPR 6 X 5 YARDS HK CLSR (GAUZE/BANDAGES/DRESSINGS) ×1
BNDG ELASTIC 6INX 5YD STR LF (GAUZE/BANDAGES/DRESSINGS) ×1 IMPLANT
BOWL SMART MIX CTS (DISPOSABLE) ×1 IMPLANT
BRNG TIB E 8 STRL KN LT MDL (Insert) ×1 IMPLANT
BSPLAT TIB E STRL KN LT MED TI (Joint) ×1 IMPLANT
CEMENT HV SMART SET (Cement) ×1 IMPLANT
COVER SURGICAL LIGHT HANDLE (MISCELLANEOUS) ×1 IMPLANT
CUFF TOURN SGL QUICK 34 (TOURNIQUET CUFF) ×1
CUFF TRNQT CYL 34X4.125X (TOURNIQUET CUFF) ×1 IMPLANT
DERMABOND ADVANCED .7 DNX12 (GAUZE/BANDAGES/DRESSINGS) IMPLANT
DRAPE INCISE IOBAN 66X45 STRL (DRAPES) ×3 IMPLANT
DRAPE U-SHAPE 47X51 STRL (DRAPES) ×1 IMPLANT
DRESSING AQUACEL AG SP 3.5X10 (GAUZE/BANDAGES/DRESSINGS) ×1 IMPLANT
DRSG ADAPTIC 3X8 NADH LF (GAUZE/BANDAGES/DRESSINGS) IMPLANT
DRSG AQUACEL AG ADV 3.5X10 (GAUZE/BANDAGES/DRESSINGS) IMPLANT
DRSG AQUACEL AG SP 3.5X10 (GAUZE/BANDAGES/DRESSINGS) ×1
DURAPREP 26ML APPLICATOR (WOUND CARE) ×1 IMPLANT
ELECT REM PT RETURN 15FT ADLT (MISCELLANEOUS) ×1 IMPLANT
GAUZE PAD ABD 8X10 STRL (GAUZE/BANDAGES/DRESSINGS) IMPLANT
GAUZE SPONGE 4X4 12PLY STRL (GAUZE/BANDAGES/DRESSINGS) IMPLANT
GLOVE BIO SURGEON STRL SZ 6.5 (GLOVE) ×2 IMPLANT
GLOVE BIO SURGEON STRL SZ7 (GLOVE) ×1 IMPLANT
GLOVE BIO SURGEON STRL SZ8 (GLOVE) ×2 IMPLANT
GLOVE BIOGEL PI IND STRL 7.0 (GLOVE) ×2 IMPLANT
GLOVE BIOGEL PI IND STRL 8 (GLOVE) ×2 IMPLANT
GOWN STRL REUS W/ TWL LRG LVL3 (GOWN DISPOSABLE) ×1 IMPLANT
GOWN STRL REUS W/TWL LRG LVL3 (GOWN DISPOSABLE) ×1
HANDPIECE INTERPULSE COAX TIP (DISPOSABLE) ×1
IMMOBILIZER KNEE 20 (SOFTGOODS) ×1
IMMOBILIZER KNEE 20 THIGH 36 (SOFTGOODS) ×1 IMPLANT
INSERT TIB BEAR PS KNEE E 8 LT (Insert) IMPLANT
KIT TURNOVER KIT A (KITS) IMPLANT
MANIFOLD NEPTUNE II (INSTRUMENTS) ×1 IMPLANT
NS IRRIG 1000ML POUR BTL (IV SOLUTION) ×1 IMPLANT
PACK TOTAL KNEE CUSTOM (KITS) ×1 IMPLANT
PADDING CAST COTTON 6X4 STRL (CAST SUPPLIES) ×2 IMPLANT
PIN DRILL HDLS TROCAR 75 4PK (PIN) IMPLANT
PROTECTOR NERVE ULNAR (MISCELLANEOUS) ×1 IMPLANT
PSN PK CMT FEM LM SZ 5 KNEE (Orthopedic Implant) ×1 IMPLANT
SCREW HEADED 33MM KNEE (MISCELLANEOUS) IMPLANT
SCREW HEADED 48MM KNEE (MISCELLANEOUS) IMPLANT
SET HNDPC FAN SPRY TIP SCT (DISPOSABLE) ×1 IMPLANT
STRIP CLOSURE SKIN 1/2X4 (GAUZE/BANDAGES/DRESSINGS) ×2 IMPLANT
SURFACE ARTC PRSNA FEM SZ 5 KN (Orthopedic Implant) IMPLANT
SUT MNCRL AB 4-0 PS2 18 (SUTURE) ×1 IMPLANT
SUT STRATAFIX 0 PDS 27 VIOLET (SUTURE) ×1
SUT VIC AB 2-0 CT1 27 (SUTURE) ×2
SUT VIC AB 2-0 CT1 TAPERPNT 27 (SUTURE) ×2 IMPLANT
SUTURE STRATFX 0 PDS 27 VIOLET (SUTURE) ×1 IMPLANT
SYR 50ML LL SCALE MARK (SYRINGE) ×1 IMPLANT
SYSTEM KNEE PART TIB LM SZ E (Joint) IMPLANT
WATER STERILE IRR 1000ML POUR (IV SOLUTION) ×1 IMPLANT
WRAP KNEE MAXI GEL POST OP (GAUZE/BANDAGES/DRESSINGS) IMPLANT

## 2023-04-11 NOTE — Interval H&P Note (Signed)
History and Physical Interval Note:  04/11/2023 6:26 AM  Bryan Austin  has presented today for surgery, with the diagnosis of left knee osteoarthritis.  The various methods of treatment have been discussed with the patient and family. After consideration of risks, benefits and other options for treatment, the patient has consented to  Procedure(s): LEFT KNEE MEDIAL UNICOMPARTMENTAL ARTHROPLASTY VS TOTAL KNEE ARTHROPLASTY (Left) as a surgical intervention.  The patient's history has been reviewed, patient examined, no change in status, stable for surgery.  I have reviewed the patient's chart and labs.  Questions were answered to the patient's satisfaction.     Homero Fellers Isidora Laham

## 2023-04-11 NOTE — Op Note (Signed)
OPERATIVE REPORT-UNICOMPARTMENTAL ARTHROPLASTY  PREOPERATIVE DIAGNOSIS: Medial compartment osteoarthritis, Left knee  POSTOPERATIVE DIAGNOSIS: Medial compartment osteoarthritis, Left knee  PROCEDURE: Left knee medial unicompartmental arthroplasty. ([facility])  SURGEON: Ollen Gross, MD   ASSISTANT: Arther Abbott, PA-C  ANESTHESIA:  Adductor canal block and spinal.   ESTIMATED BLOOD LOSS: Minimal.   DRAINS:None  TOURNIQUET TIME:  Total Tourniquet Time Documented: Thigh (Left) - 34 minutes Total: Thigh (Left) - 34 minutes   COMPLICATIONS: None.   CONDITION: Stable to recovery.   BRIEF CLINICAL NOTE:  Bryan Austin is a 62 y.o. male , who has  significant isolated medial compartment arthritis of the Left knee. The patient has had nonoperative management including injections of cortisone and viscous supplements. Unfortunately, the pain persists.  Radiograph showed isolated medial compartment bone-on-bone arthritis  with normal-appearing patellofemoral and lateral compartments. The patient presents now for left knee unicompartmental arthroplasty.   PROCEDURE IN DETAIL: After successful administration of  Adductor canal block and spinal anesthetic, a tourniquet was placed high on theLeft thigh and the Left lower extremity prepped and draped in usual sterile fashion. Extremity was wrapped in an Esmarch, knee flexed, and tourniquet inflated to 300 mmHg.       A midline incision was made with a 10 blade through subcutaneous tissue to the extensor mechanism. A fresh blade was used to make a  medial parapatellar arthrotomy. Soft tissue on the proximal medial  tibia subperiosteally elevated to the joint line with a knife and into  the semimembranosus bursa with a Cobb elevator. The patella was inspected and there is chondromalacia but no unstable defects. The patella was  subluxed laterally, and the knee flexed 90 degrees. The ACL was intact.  The marginal osteophytes on the  medial femur and tibia were removed with  a rongeur. The medial meniscus was also removed. The extramedullary tibial cutting guide was placed referencing Proximally at the medial aspect of the tibial tubercle and distally along the 2nd metatarsal axis and tibial crest. 6 degrees of posterior slope was dialed in and the block was pinned to remove 4 mm from the medial tibial surface.The cut is made with an oscillating saw and the cut bone removed.      The 9 mm spacer was then placed with the knee in extension with a stable fit. The distal femoral cutting guide was attached to the spacer in extension and pinned to make the distal femur cut with an oscillating saw.  The trial size 5 is placed and is most appropriate. The femoral preparation is completed with the posterior and chamfer cuts and drilling of the 2 lug holes. The trial size 5 femur is placed with excellent fit. The 9 mm spacer is placed and there is excellent balance through full range of motion.  The trial and the spacer are removed and tibia addressed.      The tibial sizer is placed and size E is most appropriate. The proximal tibia is prepared with the drill holes and keel for the size E The size E implant is placed with excellent fit. The trials are removed and cut bone surfaces prepared with pulsatile lavage. The cement is mixed and once ready for implantation The size E tibia and size 5 femur are cemented into place and all extruded cement removed. The 8 mm insert is then placed into the tibial tray  and locked into position. The knee is placed through a full range of motion with excellent stability.  I then injected the extensor mechanism, periosteum of  the femur and subcu tissues, a total of 20 mL of Exparel mixed with 30  mL of saline. Wound was copiously irrigated with saline solution, and the arthrotomy closed over a Hemovac drain with a running #0 Stratofix  suture. The subcutaneous was closed with  interrupted 2-0  Vicryl and subcuticular running 4-0 Monocryl. The drain  was hooked to suction. Incision cleaned and dried and Steri-Strips and  a bulky sterile dressing applied. The tourniquet was released after a  total time of 34 minutes. This was done after closing the extensor  mechanism. The wound was closed and a bulky sterile dressing was  applied. The operative limb was placed into a knee immobilizer, and the patient awakened and transported to recovery room in stable condition.       Please note that a surgical assistant was a medical necessity for this  procedure in order to perform it in a safe and expeditious manner.  Assistance was necessary for retracting vital ligaments, neurovascular  structures, as well as for proper positioning of the limb to allow for  appropriate bone cuts and appropriate placement of the prosthesis.    [name], MD

## 2023-04-11 NOTE — Anesthesia Postprocedure Evaluation (Signed)
Anesthesia Post Note  Patient: Bryan Austin  Procedure(s) Performed: LEFT KNEE MEDIAL UNICOMPARTMENTAL ARTHROPLASTY (Left: Knee)     Patient location during evaluation: PACU Anesthesia Type: Spinal Level of consciousness: oriented and awake and alert Pain management: pain level controlled Vital Signs Assessment: post-procedure vital signs reviewed and stable Respiratory status: spontaneous breathing, respiratory function stable and patient connected to nasal cannula oxygen Cardiovascular status: blood pressure returned to baseline and stable Postop Assessment: no headache, no backache, no apparent nausea or vomiting and spinal receding Anesthetic complications: no  No notable events documented.  Last Vitals:  Vitals:   04/11/23 1030 04/11/23 1045  BP: (!) 147/68 (!) 154/76  Pulse: 68 74  Resp: 19 13  Temp:  36.4 C  SpO2: 93% 100%    Last Pain:  Vitals:   04/11/23 1045  TempSrc:   PainSc: 0-No pain                 Shelton Silvas

## 2023-04-11 NOTE — Discharge Instructions (Addendum)
Bryan Gross, MD Total Joint Specialist [facility] [address], [address] [place], [place] [address] [phone]  KNEE REPLACEMENT POSTOPERATIVE DIRECTIONS  Knee Rehabilitation, Guidelines Following Surgery  Results after knee surgery are often greatly improved when you follow the exercise, range of motion and muscle strengthening exercises prescribed by your doctor. Safety measures are also important to protect the knee from further injury. If any of these exercises cause you to have increased pain or swelling in your knee joint, decrease the amount until you are comfortable again and slowly increase them. If you have problems or questions, call your caregiver or physical therapist for advice.   BLOOD CLOT PREVENTION Take an 81 mg Aspirin two times a day for three weeks following surgery. Then resume one 81 mg aspirin once a day You may resume your vitamins/supplements upon discharge from the hospital. Do not take any NSAIDs (Advil, Aleve, Ibuprofen, Meloxicam, etc.) until you are 3 weeks out from surgery  HOME CARE INSTRUCTIONS  Remove items at home which could result in a fall. This includes throw rugs or furniture in walking pathways.  ICE to the affected knee as much as tolerated. Icing helps control swelling. If the swelling is well controlled you will be more comfortable and rehab easier. Continue to use ice on the knee for pain and swelling from surgery. You may notice swelling that will progress down to the foot and ankle. This is normal after surgery. Elevate the leg when you are not up walking on it.    Continue to use the breathing machine which will help keep your temperature down. It is common for your temperature to cycle up and down following surgery, especially at night when you are not up moving around and exerting yourself. The breathing machine keeps your lungs expanded and your temperature down. Do not place pillow under the operative knee, focus on  keeping the knee straight while resting  DIET You may resume your previous home diet once you are discharged from the hospital.  DRESSING / WOUND CARE / SHOWERING Keep your bulky bandage on for 2 days. On the third post-operative day you may remove the Ace bandage and gauze. There is a waterproof adhesive bandage on your skin which will stay in place until your first follow-up appointment. Once you remove this you will not need to place another bandage You may begin showering 3 days following surgery, but do not submerge the incision under water.  ACTIVITY For the first 5 days, the key is rest and control of pain and swelling Do your home exercises twice a day starting on post-operative day 3. On the days you go to physical therapy, just do the home exercises once that day. You should rest, ice and elevate the leg for 50 minutes out of every hour. Get up and walk/stretch for 10 minutes per hour. After 5 days you can increase your activity slowly as tolerated. Walk with your walker as instructed. Use the walker until you are comfortable transitioning to a cane. Walk with the cane in the opposite hand of the operative leg. You may discontinue the cane once you are comfortable and walking steadily. Avoid periods of inactivity such as sitting longer than an hour when not asleep. This helps prevent blood clots.  You may discontinue the knee immobilizer once you are able to perform a straight leg raise while lying down. You may resume a sexual relationship in one month or when given the OK by your doctor.  You may return to work once you are cleared by your doctor.  Do not drive a car for 6 weeks or until released by your surgeon.  Do not drive while taking narcotics.  TED HOSE STOCKINGS Wear the elastic stockings on both legs for three weeks following surgery during the day. You may remove them at night for sleeping.  WEIGHT BEARING Weight bearing as tolerated with assist device (walker, cane,  etc) as directed, use it as long as suggested by your surgeon or therapist, typically at least 4-6 weeks.  POSTOPERATIVE CONSTIPATION PROTOCOL Constipation - defined medically as fewer than three stools per week and severe constipation as less than one stool per week.  One of the most common issues patients have following surgery is constipation.  Even if you have a regular bowel pattern at home, your normal regimen is likely to be disrupted due to multiple reasons following surgery.  Combination of anesthesia, postoperative narcotics, change in appetite and fluid intake all can affect your bowels.  In order to avoid complications following surgery, here are some recommendations in order to help you during your recovery period.  Colace (docusate) - Pick up an over-the-counter form of Colace or another stool softener and take twice a day as long as you are requiring postoperative pain medications.  Take with a full glass of water daily.  If you experience loose stools or diarrhea, hold the colace until you stool forms back up. If your symptoms do not get better within 1 week or if they get worse, check with your doctor. Dulcolax (bisacodyl) - Pick up over-the-counter and take as directed by the product packaging as needed to assist with the movement of your bowels.  Take with a full glass of water.  Use this product as needed if not relieved by Colace only.  MiraLax (polyethylene glycol) - Pick up over-the-counter to have on hand. MiraLax is a solution that will increase the amount of water in your bowels to assist with bowel movements.  Take as directed and can mix with a glass of water, juice, soda, coffee, or tea. Take if you go more than two days without a movement. Do not use MiraLax more than once per day. Call your doctor if you are still constipated or irregular after using this medication for 7 days in a row.  If you continue to have problems with postoperative constipation, please contact the  office for further assistance and recommendations.  If you experience "the worst abdominal pain ever" or develop nausea or vomiting, please contact the office immediatly for further recommendations for treatment.  ITCHING If you experience itching with your medications, try taking only a single pain pill, or even half a pain pill at a time.  You can also use Benadryl over the counter for itching or also to help with sleep.   MEDICATIONS See your medication summary on the "After Visit Summary" that the nursing staff will review with you prior to discharge.  You may have some home medications which will be placed on hold until you complete the course of blood thinner medication.  It is important for you to complete the blood thinner medication as prescribed by your surgeon.  Continue your approved medications as instructed at time of discharge.  PRECAUTIONS If you experience chest pain or shortness of breath - call 911 immediately for transfer to the hospital emergency department.  If you develop a fever greater that 101 F, purulent drainage from wound, increased redness or drainage from  wound, foul odor from the wound/dressing, or calf pain - CONTACT YOUR SURGEON.                                                   FOLLOW-UP APPOINTMENTS Make sure you keep all of your appointments after your operation with your surgeon and caregivers. You should call the office at the above phone number and make an appointment for approximately two weeks after the date of your surgery or on the date instructed by your surgeon outlined in the "After Visit Summary".  RANGE OF MOTION AND STRENGTHENING EXERCISES  Rehabilitation of the knee is important following a knee injury or an operation. After just a few days of immobilization, the muscles of the thigh which control the knee become weakened and shrink (atrophy). Knee exercises are designed to build up the tone and strength of the thigh muscles and to improve knee  motion. Often times heat used for twenty to thirty minutes before working out will loosen up your tissues and help with improving the range of motion but do not use heat for the first two weeks following surgery. These exercises can be done on a training (exercise) mat, on the floor, on a table or on a bed. Use what ever works the best and is most comfortable for you Knee exercises include:  Leg Lifts - While your knee is still immobilized in a splint or cast, you can do straight leg raises. Lift the leg to 60 degrees, hold for 3 sec, and slowly lower the leg. Repeat 10-20 times 2-3 times daily. Perform this exercise against resistance later as your knee gets better.  Quad and Hamstring Sets - Tighten up the muscle on the front of the thigh (Quad) and hold for 5-10 sec. Repeat this 10-20 times hourly. Hamstring sets are done by pushing the foot backward against an object and holding for 5-10 sec. Repeat as with quad sets.  Leg Slides: Lying on your back, slowly slide your foot toward your buttocks, bending your knee up off the floor (only go as far as is comfortable). Then slowly slide your foot back down until your leg is flat on the floor again. Angel Wings: Lying on your back spread your legs to the side as far apart as you can without causing discomfort.  A rehabilitation program following serious knee injuries can speed recovery and prevent re-injury in the future due to weakened muscles. Contact your doctor or a physical therapist for more information on knee rehabilitation.   POST-OPERATIVE OPIOID TAPER INSTRUCTIONS: It is important to wean off of your opioid medication as soon as possible. If you do not need pain medication after your surgery it is ok to stop day one. Opioids include: Codeine, Hydrocodone(Norco, Vicodin), Oxycodone(Percocet, oxycontin) and hydromorphone amongst others.  Long term and even short term use of opiods can cause: Increased pain  response Dependence Constipation Depression Respiratory depression And more.  Withdrawal symptoms can include Flu like symptoms Nausea, vomiting And more Techniques to manage these symptoms Hydrate well Eat regular healthy meals Stay active Use relaxation techniques(deep breathing, meditating, yoga) Do Not substitute Alcohol to help with tapering If you have been on opioids for less than two weeks and do not have pain than it is ok to stop all together.  Plan to wean off of opioids This plan should start  within one week post op of your joint replacement. Maintain the same interval or time between taking each dose and first decrease the dose.  Cut the total daily intake of opioids by one tablet each day Next start to increase the time between doses. The last dose that should be eliminated is the evening dose.   IF YOU ARE TRANSFERRED TO A SKILLED REHAB FACILITY If the patient is transferred to a skilled rehab facility following release from the hospital, a list of the current medications will be sent to the facility for the patient to continue.  When discharged from the skilled rehab facility, please have the facility set up the patient's Home Health Physical Therapy prior to being released. Also, the skilled facility will be responsible for providing the patient with their medications at time of release from the facility to include their pain medication, the muscle relaxants, and their blood thinner medication. If the patient is still at the rehab facility at time of the two week follow up appointment, the skilled rehab facility will also need to assist the patient in arranging follow up appointment in our office and any transportation needs.  MAKE SURE YOU:  Understand these instructions.  Get help right away if you are not doing well or get worse.   DENTAL ANTIBIOTICS:  In most cases prophylactic antibiotics for Dental procdeures after total joint surgery are not  necessary.  Exceptions are as follows:  1. History of prior total joint infection  2. Severely immunocompromised (Organ Transplant, cancer chemotherapy, Rheumatoid biologic meds such as Humera)  3. Poorly controlled diabetes (A1C &gt; 8.0, blood glucose over 200)  If you have one of these conditions, contact your surgeon for an antibiotic prescription, prior to your dental procedure.    Pick up stool softner and laxative for home use following surgery while on pain medications. Do not submerge incision under water. Please use good hand washing techniques while changing dressing each day. May shower starting three days after surgery. Please use a clean towel to pat the incision dry following showers. Continue to use ice for pain and swelling after surgery. Do not use any lotions or creams on the incision until instructed by your surgeon.

## 2023-04-11 NOTE — Transfer of Care (Signed)
Immediate Anesthesia Transfer of Care Note  Patient: Bryan Austin  Procedure(s) Performed: LEFT KNEE MEDIAL UNICOMPARTMENTAL ARTHROPLASTY (Left: Knee)  Patient Location: PACU  Anesthesia Type:Spinal  Level of Consciousness: awake, alert , and oriented  Airway & Oxygen Therapy: Patient Spontanous Breathing and Patient connected to nasal cannula oxygen  Post-op Assessment: Report given to RN and Post -op Vital signs reviewed and stable  Post vital signs: Reviewed and stable  Last Vitals:  Vitals Value Taken Time  BP 122/68 04/11/23 0851  Temp    Pulse 78 04/11/23 0853  Resp 13 04/11/23 0853  SpO2 91 % 04/11/23 0853  Vitals shown include unfiled device data.  Last Pain:  Vitals:   04/11/23 0606  TempSrc: Oral         Complications: No notable events documented.

## 2023-04-11 NOTE — Anesthesia Procedure Notes (Signed)
Spinal  Start time: 04/11/2023 7:24 AM End time: 04/11/2023 7:27 AM Reason for block: surgical anesthesia Staffing Performed: anesthesiologist  Anesthesiologist: Shelton Silvas, MD Performed by: Shelton Silvas, MD Authorized by: Shelton Silvas, MD   Preanesthetic Checklist Completed: patient identified, IV checked, site marked, risks and benefits discussed, surgical consent, monitors and equipment checked, pre-op evaluation and timeout performed Spinal Block Patient position: sitting Prep: DuraPrep and site prepped and draped Location: L3-4 Injection technique: single-shot Needle Needle type: Pencan  Needle gauge: 24 G Needle length: 10 cm Needle insertion depth: 10 cm Additional Notes Patient tolerated well. No immediate complications.  Functioning IV was confirmed and monitors were applied. Sterile prep and drape, including hand hygiene and sterile gloves were used. The patient was positioned and the back was prepped. The skin was anesthetized with lidocaine. Free flow of clear CSF was obtained prior to injecting local anesthetic into the CSF. The spinal needle aspirated freely following injection. The needle was carefully withdrawn. The patient tolerated the procedure well.

## 2023-04-11 NOTE — Evaluation (Signed)
Physical Therapy Evaluation Patient Details Name: Bryan Austin MRN: [id number] DOB: Oct 03, 1960 Today's Date: 04/11/2023  History of Present Illness  62 yo male presents to therapy s/p L medial compartmental knee arthoplasty on 04/11/2023 due to failure of conservative measures. Pt PMH includes but is not limited to: CAD, DM II, HLD, dCHF, HTN, aortic valve disorder, angina pectoris, GERD, gout, and OSA.  Clinical Impression    Bryan Austin is a 62 y.o. male POD 0 s/p L knee arthoplasty. Patient reports IND with mobility at baseline. Patient is now limited by functional impairments (see PT problem list below) and requires CGA for transfers and gait with RW. Patient was able to ambulate 50 feet with RW and CGA and cues for safe walker management. Patient educated on safe sequencing for stair mobility with use of RW, car transfers, bed transfers with use of gait belt as leg lifter if needed, fall risk prevention, pain management and goal, use of CP/Ice, safety with lower body dressing pt and spouse verbalized understanding of safe guarding position for people assisting with mobility. Patient instructed in exercises to facilitate ROM and circulation reviewed and HO provided.  Patient will benefit from continued skilled PT interventions to address impairments and progress towards PLOF. Patient has met mobility goals at adequate level for discharge home with family support and OPPT services starting 8/22; will continue to follow if pt continues acute stay to progress towards Mod I goals.       If plan is discharge home, recommend the following: A little help with walking and/or transfers;A little help with bathing/dressing/bathroom;Assistance with cooking/housework;Help with stairs or ramp for entrance;Supervision due to cognitive status   Can travel by private vehicle        Equipment Recommendations Rolling walker (2 wheels) (provided and adjusted at eval)  Recommendations for Other Services        Functional Status Assessment Patient has had a recent decline in their functional status and demonstrates the ability to make significant improvements in function in a reasonable and predictable amount of time.     Precautions / Restrictions Precautions Precautions: Knee;Fall Restrictions Weight Bearing Restrictions: No      Mobility  Bed Mobility Overal bed mobility: Needs Assistance Bed Mobility: Supine to Sit     Supine to sit: Supervision     General bed mobility comments: min cues    Transfers Overall transfer level: Needs assistance Equipment used: Rolling walker (2 wheels) Transfers: Sit to/from Stand Sit to Stand: Contact guard assist           General transfer comment: min cues for proper UE placement    Ambulation/Gait Ambulation/Gait assistance: Contact guard assist Gait Distance (Feet): 50 Feet Assistive device: Rolling walker (2 wheels) Gait Pattern/deviations: Step-to pattern, Antalgic Gait velocity: decreased     General Gait Details: limited L knee flexion in swing phase, min cues for safety and proper posture  Stairs Stairs: Yes Stairs assistance: Contact guard assist Stair Management: Two rails Number of Stairs: 3 General stair comments: cues for proper technique and sequencing with B handrails and pt and family ed provided on use of RW to navigate single step to enter home  Wheelchair Mobility     Tilt Bed    Modified Rankin (Stroke Patients Only)       Balance  Pertinent Vitals/Pain Pain Assessment Pain Assessment: 0-10 Pain Score: 2  Pain Location: L knee Pain Descriptors / Indicators: Aching, Discomfort, Operative site guarding, Constant Pain Intervention(s): Limited activity within patient's tolerance, Monitored during session, Premedicated before session, Repositioned, Ice applied    Home Living Family/patient expects to be discharged to:: Private  residence Living Arrangements: Spouse/significant other Available Help at Discharge: Family Type of Home: House Home Access: Stairs to enter Entrance Stairs-Rails: None Entrance Stairs-Number of Steps: 1   Home Layout: Two level;Able to live on main level with bedroom/bathroom Home Equipment: None      Prior Function Prior Level of Function : Independent/Modified Independent;Working/employed;Driving             Mobility Comments: IND with all ADLs, self care tasks, IADLs, working and driving       Extremity/Trunk Assessment        Lower Extremity Assessment Lower Extremity Assessment: LLE deficits/detail LLE Deficits / Details: ankle DF/PF 5/5; SLR < 10 degree lag LLE Sensation: WNL    Cervical / Trunk Assessment Cervical / Trunk Assessment:  (wfl)  Communication   Communication Communication: No apparent difficulties  Cognition Arousal: Alert Behavior During Therapy: WFL for tasks assessed/performed Overall Cognitive Status: Within Functional Limits for tasks assessed                                          General Comments      Exercises Total Joint Exercises Ankle Circles/Pumps: AROM, Both, 20 reps Quad Sets: AROM, Left, 5 reps Short Arc Quad: AROM, Left, 5 reps Heel Slides: AROM, Left, 5 reps Hip ABduction/ADduction: AROM, Left, 5 reps Knee Flexion: AROM, Left, 5 reps   Assessment/Plan    PT Assessment Patient needs continued PT services  PT Problem List Decreased strength;Decreased range of motion;Decreased activity tolerance;Decreased balance;Decreased mobility;Decreased coordination;Pain       PT Treatment Interventions DME instruction;Gait training;Stair training;Functional mobility training;Therapeutic activities;Therapeutic exercise;Balance training;Neuromuscular re-education;Patient/family education;Modalities    PT Goals (Current goals can be found in the Care Plan section)  Acute Rehab PT Goals Patient Stated Goal: to  get stronger return to work and have R TKA in November PT Goal Formulation: With patient Time For Goal Achievement: 04/25/23 Potential to Achieve Goals: Good    Frequency 7X/week     Co-evaluation               AM-PAC PT "6 Clicks" Mobility  Outcome Measure Help needed turning from your back to your side while in a flat bed without using bedrails?: None Help needed moving from lying on your back to sitting on the side of a flat bed without using bedrails?: None Help needed moving to and from a bed to a chair (including a wheelchair)?: A Little Help needed standing up from a chair using your arms (e.g., wheelchair or bedside chair)?: A Little Help needed to walk in hospital room?: A Little Help needed climbing 3-5 steps with a railing? : A Little 6 Click Score: 20    End of Session Equipment Utilized During Treatment: Gait belt Activity Tolerance: Patient tolerated treatment well;No increased pain Patient left: in chair;with call bell/phone within reach;with family/visitor present Nurse Communication: Mobility status (pt readiness for d/c from PT standpoint) PT Visit Diagnosis: Unsteadiness on feet (R26.81);Other abnormalities of gait and mobility (R26.89);Muscle weakness (generalized) (M62.81);Difficulty in walking, not elsewhere classified (R26.2);Pain Pain - Right/Left: Left Pain -  part of body: Knee;Leg    Time: 6045-4098 PT Time Calculation (min) (ACUTE ONLY): 41 min   Charges:   PT Evaluation $PT Eval Low Complexity: 1 Low PT Treatments $Gait Training: 8-22 mins $Therapeutic Exercise: 8-22 mins PT General Charges $$ ACUTE PT VISIT: 1 Visit         Johnny Bridge, PT Acute Rehab   Bryan Austin 04/11/2023, 1:47 PM

## 2023-04-11 NOTE — Anesthesia Procedure Notes (Signed)
Anesthesia Regional Block: Adductor canal block   Pre-Anesthetic Checklist: , timeout performed,  Correct Patient, Correct Site, Correct Laterality,  Correct Procedure, Correct Position, site marked,  Risks and benefits discussed,  Surgical consent,  Pre-op evaluation,  At surgeon's request and post-op pain management  Laterality: Left  Prep: chloraprep       Needles:  Injection technique: Single-shot  Needle Type: Echogenic Stimulator Needle     Needle Length: 9cm  Needle Gauge: 21     Additional Needles:   Procedures:,,,, ultrasound used (permanent image in chart),,    Narrative:  Start time: 04/11/2023 7:00 AM End time: 04/11/2023 7:05 AM Injection made incrementally with aspirations every 5 mL.  Performed by: Personally  Anesthesiologist: Shelton Silvas, MD  Additional Notes: Discussed risks and benefits of the nerve block in detail, including but not limited vascular injury, permanent nerve damage and infection.   Patient tolerated the procedure well. Local anesthetic introduced in an incremental fashion under minimal resistance after negative aspirations. No paresthesias were elicited. After completion of the procedure, no acute issues were identified and patient continued to be monitored by RN.

## 2023-04-12 ENCOUNTER — Encounter (HOSPITAL_COMMUNITY): Payer: Self-pay | Admitting: Orthopedic Surgery

## 2023-04-28 ENCOUNTER — Telehealth: Payer: Self-pay | Admitting: *Deleted

## 2023-04-28 NOTE — Telephone Encounter (Signed)
   Pre-operative Risk Assessment    Patient Name: Bryan Austin  DOB: 1961/01/14 MRN: [id number]    LAST OFFICE VISIT 01/2023 CLEARANCE FOR THE SAME SURGERY  Request for Surgical Clearance    Procedure:   RT TOTAL KNEE ARTHROPLASTY  Date of Surgery:  Clearance 08/15/23                                 Surgeon:  Ollen Gross, MD Surgeon's Group or Practice Name:  [facility] Phone number:  [phone] Fax number:  [phone]   Type of Clearance Requested:   - Medical  - Pharmacy:  Hold Aspirin NOT INDICATED    Type of Anesthesia:   CHOICE   Additional requests/questions:    [name]   04/28/2023, 10:41 AM

## 2023-04-28 NOTE — Telephone Encounter (Signed)
   Name: Bryan Austin  DOB: May 10, 1961  MRN: [id number]  Primary Cardiologist: Olga Millers, MD   Preoperative team, please contact this patient and set up a phone call appointment for further preoperative risk assessment. Please obtain consent and complete medication review. Thank you for your help.  I confirm that guidance regarding antiplatelet and oral anticoagulation therapy has been completed and, if necessary, noted below.  Per office protocol, if patient is without any new symptoms or concerns at the time of their virtual visit, he/she may hold ASA for 7 days prior to procedure IF requested by surgeon. Please resume ASA as soon as possible postprocedure, at the discretion of the surgeon.  His endocrinologist, Dr. Talmage Coin, prescribes Ozempic and will need to offer recommendations on when to discontinue.     Perlie Gold, PA-C 04/28/2023, 11:13 AM [facility]

## 2023-04-29 ENCOUNTER — Telehealth: Payer: Self-pay

## 2023-04-29 NOTE — Telephone Encounter (Signed)
Pt is scheduled for tele on 08/01/23. Med rec and consent done    Patient Consent for Virtual Visit        Bryan Austin has provided verbal consent on 04/29/2023 for a virtual visit (video or telephone).   CONSENT FOR VIRTUAL VISIT FOR:  Bryan Austin  By participating in this virtual visit I agree to the following:  I hereby voluntarily request, consent and authorize [facility] and its employed or contracted physicians, physician assistants, nurse practitioners or other licensed health care professionals (the Practitioner), to provide me with telemedicine health care services (the "Services") as deemed necessary by the treating Practitioner. I acknowledge and consent to receive the Services by the Practitioner via telemedicine. I understand that the telemedicine visit will involve communicating with the Practitioner through live audiovisual communication technology and the disclosure of certain medical information by electronic transmission. I acknowledge that I have been given the opportunity to request an in-person assessment or other available alternative prior to the telemedicine visit and am voluntarily participating in the telemedicine visit.  I understand that I have the right to withhold or withdraw my consent to the use of telemedicine in the course of my care at any time, without affecting my right to future care or treatment, and that the Practitioner or I may terminate the telemedicine visit at any time. I understand that I have the right to inspect all information obtained and/or recorded in the course of the telemedicine visit and may receive copies of available information for a reasonable fee.  I understand that some of the potential risks of receiving the Services via telemedicine include:  Delay or interruption in medical evaluation due to technological equipment failure or disruption; Information transmitted may not be sufficient (e.g. poor resolution of images)  to allow for appropriate medical decision making by the Practitioner; and/or  In rare instances, security protocols could fail, causing a breach of personal health information.  Furthermore, I acknowledge that it is my responsibility to provide information about my medical history, conditions and care that is complete and accurate to the best of my ability. I acknowledge that Practitioner's advice, recommendations, and/or decision may be based on factors not within their control, such as incomplete or inaccurate data provided by me or distortions of diagnostic images or specimens that may result from electronic transmissions. I understand that the practice of medicine is not an exact science and that Practitioner makes no warranties or guarantees regarding treatment outcomes. I acknowledge that a copy of this consent can be made available to me via my patient portal [facility] MyChart), or I can request a printed copy by calling the office of [facility].    I understand that my insurance will be billed for this visit.   I have read or had this consent read to me. I understand the contents of this consent, which adequately explains the benefits and risks of the Services being provided via telemedicine.  I have been provided ample opportunity to ask questions regarding this consent and the Services and have had my questions answered to my satisfaction. I give my informed consent for the services to be provided through the use of telemedicine in my medical care

## 2023-04-29 NOTE — Telephone Encounter (Signed)
Pt is scheduled for tele on 08/01/23. Med rec and consent done

## 2023-07-26 ENCOUNTER — Ambulatory Visit (HOSPITAL_COMMUNITY): Payer: BC Managed Care – PPO | Attending: Cardiology

## 2023-07-26 DIAGNOSIS — Q2381 Bicuspid aortic valve: Secondary | ICD-10-CM | POA: Insufficient documentation

## 2023-07-26 DIAGNOSIS — I350 Nonrheumatic aortic (valve) stenosis: Secondary | ICD-10-CM | POA: Diagnosis not present

## 2023-07-26 LAB — ECHOCARDIOGRAM COMPLETE
AR max vel: 1.31 cm2
AV Area VTI: 1.41 cm2
AV Area mean vel: 1.42 cm2
AV Mean grad: 19.7 mm[Hg]
AV Peak grad: 38 mm[Hg]
Ao pk vel: 3.08 m/s
Area-P 1/2: 3.05 cm2
S' Lateral: 3.6 cm

## 2023-07-29 ENCOUNTER — Other Ambulatory Visit: Payer: Self-pay | Admitting: *Deleted

## 2023-07-29 DIAGNOSIS — Q2381 Bicuspid aortic valve: Secondary | ICD-10-CM

## 2023-08-01 ENCOUNTER — Ambulatory Visit: Payer: BC Managed Care – PPO | Attending: Cardiovascular Disease | Admitting: Student

## 2023-08-01 DIAGNOSIS — Z01810 Encounter for preprocedural cardiovascular examination: Secondary | ICD-10-CM | POA: Diagnosis not present

## 2023-08-01 NOTE — Progress Notes (Signed)
Virtual Visit via Telephone Note   Because of Bryan Austin's co-morbid illnesses, he is at least at moderate risk for complications without adequate follow up.  This format is felt to be most appropriate for this patient at this time.  The patient did not have access to video technology/had technical difficulties with video requiring transitioning to audio format only (telephone).  All issues noted in this document were discussed and addressed.  No physical exam could be performed with this format.  Please refer to the patient's chart for his consent to telehealth for [facility].  Evaluation Performed:  Preoperative cardiovascular risk assessment _____________   Date:  08/01/2023   Patient ID:  Bryan Austin, DOB 15-May-1961, MRN [id number] Patient Location:  Home Provider location:   Office  Primary Care Provider:  Ardyth Man, MD Primary Cardiologist:  Olga Millers, MD  Chief Complaint / Patient Profile   62 y.o. y/o male with a h/o nonobstructive CAD, chronic diastolic heart failure, bicuspid aortic valve with moderate AS, hypertension, hyperlipidemia, T2DM who is pending right total knee arthroplasty by Dr. Lequita Halt and presents today for telephonic preoperative cardiovascular risk assessment.  History of Present Illness    Bryan Austin is a 62 y.o. male who presents via audio/video conferencing for a telehealth visit today.  Pt was last seen in cardiology clinic on 12/14/2022 by Dr. Jens Som.  At that time Bryan Austin was stable from a cardiac standpoint.  The patient is now pending procedure as outlined above. Since his last visit, he is doing well. Patient denies shortness of breath, dyspnea on exertion, lower extremity edema, orthopnea or PND. No chest pain, pressure, or tightness. No palpitations. He is very active with his full-time work doing moderate to high intensity activities such as climbing ladders. He often gets 10,000 steps a day.   Past  Medical History    Past Medical History:  Diagnosis Date   Arthritis    Neck C3,C4, C5   Complication of anesthesia    wakes up during procedures per pt   Diabetes mellitus without complication (HCC)    GERD without esophagitis 2016   NEG CARDIAC EVALUATION [facility]   Gout    Heart murmur    Hypertension    Sleep apnea    no cpap , no mouth device   Past Surgical History:  Procedure Laterality Date   CHOLECYSTECTOMY  08/23/2004   GB FULL OF "SAND", NEEDED A DRAIN DUE TO INFECTION   COLONOSCOPY  [facility]   COLONOSCOPY N/A 10/14/2014   Procedure: COLONOSCOPY;  Surgeon: West Bali, MD;  [facility];  Service: Endoscopy;  Laterality: N/A;  100pm   COLONOSCOPY WITH PROPOFOL N/A 02/24/2021   Procedure: COLONOSCOPY WITH PROPOFOL;  Surgeon: Dolores Frame, MD;  Location: [facility];  Service: Gastroenterology;  Laterality: N/A;  8:35   ESOPHAGOGASTRODUODENOSCOPY N/A 10/14/2014   Procedure: ESOPHAGOGASTRODUODENOSCOPY (EGD);  Surgeon: West Bali, MD;  [facility]: [facility];  Service: Endoscopy;  Laterality: N/A;   HEMORRHOID BANDING N/A 10/14/2014   Procedure: HEMORRHOID BANDING;  Surgeon: West Bali, MD;  [facility]: [facility];  Service: Endoscopy;  Laterality: N/A;   HEMOSTASIS CLIP PLACEMENT  02/24/2021   Procedure: HEMOSTASIS CLIP PLACEMENT;  Surgeon: Dolores Frame, MD;  [facility]: [facility];  Service: Gastroenterology;;   HERNIA REPAIR  08/23/2004   COMPLICATED BY PROLONGED ILEUS   PARTIAL KNEE ARTHROPLASTY Left 04/11/2023   Procedure: LEFT KNEE MEDIAL UNICOMPARTMENTAL  ARTHROPLASTY;  Surgeon: Ollen Gross, MD;  Location: [facility];  Service: Orthopedics;  Laterality: Left;   POLYPECTOMY  02/24/2021   Procedure: POLYPECTOMY;  Surgeon: Dolores Frame, MD;  [facility];  Service: Gastroenterology;;   urinary tract surgery      age 1    Allergies  Allergies  Allergen Reactions   Statins Other (See Comments)     Increased LFTs   Adhesive [Tape] Itching and Rash   Bee Venom Rash    Home Medications    Prior to Admission medications   Medication Sig Start Date End Date Taking? Authorizing Provider  acetaminophen (TYLENOL) 500 MG tablet Take 500-1,000 mg by mouth every 6 (six) hours as needed (pain.).    [provider]  AUVI-Q 0.3 MG/0.3ML SOAJ injection Inject 0.3 mg into the muscle as needed for anaphylaxis.    [provider]  Cholecalciferol (VITAMIN D3 PO) Take 4,000 Units by mouth in the morning.    [provider]  Coenzyme Q10-Vitamin E (QUNOL ULTRA COQ10 PO) Take 2 capsules by mouth in the morning and at bedtime.    [provider]  Colchicine 0.6 MG CAPS Take 0.6-1.2 mg by mouth daily as needed (gout flares).    [provider]  Evolocumab (REPATHA SURECLICK) 140 MG/ML SOAJ Inject 140 mg into the skin every 14 (fourteen) days. 01/27/23   Lewayne Bunting, MD  furosemide (LASIX) 40 MG tablet Take 40 mg by mouth daily as needed for edema.    [provider]  irbesartan (AVAPRO) 150 MG tablet Take 1 tablet (150 mg total) by mouth daily. 01/12/22   Lewayne Bunting, MD  Melatonin 10 MG TABS Take 20 mg by mouth at bedtime.    [provider]  methocarbamol (ROBAXIN) 500 MG tablet Take 1 tablet (500 mg total) by mouth every 6 (six) hours as needed for muscle spasms. 04/11/23   Edmisten, Lyn Hollingshead, PA  Misc Natural Products (CHOLESTEROL SUPPORT PO) Take 3 capsules by mouth in the morning. [employer] Cholesterol Support    [provider]  NON FORMULARY Inject 2 Syringes as directed once a week. 2 different types of Allergy shots    [provider]  oxyCODONE (ROXICODONE) 5 MG immediate release tablet Take 1-2 tablets (5-10 mg total) by mouth every 6 (six) hours as needed for severe pain. 04/11/23 04/10/24  Edmisten, Kristie L, PA  OZEMPIC, 0.25 OR 0.5 MG/DOSE, 2 MG/3ML SOPN Inject 0.25 mg into the skin every Friday.  12/28/22   [provider]  senna (SENOKOT) 8.6 MG tablet Take 2 tablets by mouth 2 (two) times daily.    [provider]  SYNJARDY XR 25-1000 MG TB24 Take 1 tablet by mouth in the morning. 12/28/22   [provider]  traMADol (ULTRAM) 50 MG tablet Take 1-2 tablets (50-100 mg total) by mouth every 6 (six) hours as needed for moderate pain. 04/11/23 04/10/24  Edmisten, Kristie L, PA  VTAMA 1 % CREA Apply 1 Application topically daily as needed (psoriasis.). 02/23/23   [provider]    Physical Exam    Vital Signs:  Bryan Austin does not have vital signs available for review today.  Given telephonic nature of communication, physical exam is limited. AAOx3. NAD. Normal affect.  Speech and respirations are unlabored.  Accessory Clinical Findings    None  Assessment & Plan    Primary Cardiologist: Olga Millers, MD  Preoperative cardiovascular risk assessment. Right total knee arthoplasty  with Dr. Lequita Halt on 08/15/2023.  Chart reviewed as part of pre-operative protocol coverage. According to the RCRI, patient has a 0.9% risk of MACE. 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   Given past medical history and time since last visit, based on ACC/AHA guidelines, Bryan Austin would be at acceptable risk for the planned procedure without further cardiovascular testing.   Patient was advised that if he develops new symptoms prior to surgery to contact our office to arrange a follow-up appointment.  he verbalized understanding.  Ideally aspirin should be continued without interruption, however if the bleeding risk is too great, aspirin may be held for 5-7 days prior to surgery. Please resume aspirin post operatively when it is felt to be safe from a bleeding standpoint.    I will route this recommendation to the requesting party via Epic fax  function.  Please call with questions.  Time:   Today, I have spent 5 minutes with the patient with telehealth technology discussing medical history, symptoms, and management plan.     Bryan Levering, NP  08/01/2023, 7:28 AM

## 2023-08-03 ENCOUNTER — Other Ambulatory Visit (HOSPITAL_COMMUNITY): Payer: BC Managed Care – PPO

## 2023-08-03 NOTE — Progress Notes (Signed)
TOTAL KNEE ADMISSION H&P  Patient is being admitted for right total knee arthroplasty.  Subjective:  Chief Complaint: Right knee pain.  HPI: Bryan Austin, 62 y.o. male has a history of pain and functional disability in the right knee due to arthritis and has failed non-surgical conservative treatments for greater than 12 weeks to include NSAID's and/or analgesics, corticosteriod injections, viscosupplementation injections, and activity modification. Onset of symptoms was gradual, starting several years ago with gradually worsening course since that time. The patient noted no past surgery on the right knee.  Patient currently rates pain in the right knee at 8 out of 10 with activity. Patient has night pain, worsening of pain with activity and weight bearing, and pain that interferes with activities of daily living. Patient has evidence of  bone-on-bone medial arthritis with lateral marginal osteophytes and some patellofemoral osteophytes. There is also slight lateral subluxation of the tibia  by imaging studies. There is no active infection.  Patient Active Problem List   Diagnosis Date Noted   Agatston CAC score, <100 01/26/2023   Coronary artery disease involving native coronary artery of native heart without angina pectoris 01/26/2023   Diabetes mellitus without complication (HCC) 01/26/2023   Osteoarthritis of left knee 12/04/2022   Osteoarthritis of right knee 12/04/2022   Hyperlipidemia 03/15/2022   External hemorrhoids 12/09/2020   History of colonic polyps 12/09/2020   Dislocation of proximal interphalangeal joint of left middle finger 06/12/2019   Diastolic CHF, chronic (HCC) 04/10/2018   Chest pain 04/10/2018   Morbid obesity (HCC) 04/10/2018   Essential hypertension 04/05/2018   Aortic valve disorder 04/05/2018   Angina pectoris (HCC) 09/06/2014   Nonspecific abnormal results of liver function study 01/29/2004    Past Medical History:  Diagnosis Date   Arthritis    Neck  C3,C4, C5   Complication of anesthesia    wakes up during procedures per pt   Diabetes mellitus without complication (HCC)    GERD without esophagitis 2016   NEG CARDIAC EVALUATION [facility]   Gout    Heart murmur    Hypertension    Sleep apnea    no cpap , no mouth device    Past Surgical History:  Procedure Laterality Date   CHOLECYSTECTOMY  08/23/2004   GB FULL OF "SAND", NEEDED A DRAIN DUE TO INFECTION   COLONOSCOPY  [facility]   COLONOSCOPY N/A 10/14/2014   Procedure: COLONOSCOPY;  Surgeon: West Bali, MD;  [facility];  Service: Endoscopy;  Laterality: N/A;  100pm   COLONOSCOPY WITH PROPOFOL N/A 02/24/2021   Procedure: COLONOSCOPY WITH PROPOFOL;  Surgeon: Dolores Frame, MD;  [facility];  Service: Gastroenterology;  Laterality: N/A;  8:35   ESOPHAGOGASTRODUODENOSCOPY N/A 10/14/2014   Procedure: ESOPHAGOGASTRODUODENOSCOPY (EGD);  Surgeon: West Bali, MD;  [facility];  Service: Endoscopy;  Laterality: N/A;   HEMORRHOID BANDING N/A 10/14/2014   Procedure: HEMORRHOID BANDING;  Surgeon: West Bali, MD;  [facility];  Service: Endoscopy;  Laterality: N/A;   HEMOSTASIS CLIP PLACEMENT  02/24/2021   Procedure: HEMOSTASIS CLIP PLACEMENT;  Surgeon: Dolores Frame, MD;  [facility];  Service: Gastroenterology;;   HERNIA REPAIR  08/23/2004   COMPLICATED BY PROLONGED ILEUS   PARTIAL KNEE ARTHROPLASTY Left 04/11/2023   Procedure: LEFT KNEE MEDIAL UNICOMPARTMENTAL ARTHROPLASTY;  Surgeon: Ollen Gross, MD;  Location: [facility];  Service: Orthopedics;  Laterality: Left;   POLYPECTOMY  02/24/2021   Procedure: POLYPECTOMY;  Surgeon: Levon Hedger  Alisia Ferrari, MD;  [facility];  Service: Gastroenterology;;   urinary tract surgery      age 40    Prior to Admission medications   Medication Sig Start Date End Date Taking? Authorizing Provider  acetaminophen (TYLENOL) 500 MG tablet Take 500-1,000 mg by  mouth every 6 (six) hours as needed (pain.).    [provider]  AUVI-Q 0.3 MG/0.3ML SOAJ injection Inject 0.3 mg into the muscle as needed for anaphylaxis.    [provider]  Cholecalciferol (VITAMIN D3 PO) Take 4,000 Units by mouth in the morning.    [provider]  Coenzyme Q10-Vitamin E (QUNOL ULTRA COQ10 PO) Take 2 capsules by mouth in the morning and at bedtime.    [provider]  Colchicine 0.6 MG CAPS Take 0.6-1.2 mg by mouth daily as needed (gout flares).    [provider]  Evolocumab (REPATHA SURECLICK) 140 MG/ML SOAJ Inject 140 mg into the skin every 14 (fourteen) days. 01/27/23   Lewayne Bunting, MD  furosemide (LASIX) 40 MG tablet Take 40 mg by mouth daily as needed for edema.    [provider]  irbesartan (AVAPRO) 150 MG tablet Take 1 tablet (150 mg total) by mouth daily. 01/12/22   Lewayne Bunting, MD  Melatonin 10 MG TABS Take 20 mg by mouth at bedtime.    [provider]  methocarbamol (ROBAXIN) 500 MG tablet Take 1 tablet (500 mg total) by mouth every 6 (six) hours as needed for muscle spasms. 04/11/23   Edmisten, Lyn Hollingshead, PA  Misc Natural Products (CHOLESTEROL SUPPORT PO) Take 3 capsules by mouth in the morning. [employer] Naturals Cholesterol Support    [provider]  NON FORMULARY Inject 2 Syringes as directed once a week. 2 different types of Allergy shots    [provider]  oxyCODONE (ROXICODONE) 5 MG immediate release tablet Take 1-2 tablets (5-10 mg total) by mouth every 6 (six) hours as needed for severe pain. 04/11/23 04/10/24  Edmisten, Kristie L, PA  OZEMPIC, 0.25 OR 0.5 MG/DOSE, 2 MG/3ML SOPN Inject 0.25 mg into the skin every Friday. 12/28/22   [provider]  senna (SENOKOT) 8.6 MG tablet Take 2 tablets by mouth 2 (two) times daily.    [provider]  SYNJARDY XR 25-1000 MG TB24 Take 1 tablet by mouth in the morning. 12/28/22   [provider]  traMADol  (ULTRAM) 50 MG tablet Take 1-2 tablets (50-100 mg total) by mouth every 6 (six) hours as needed for moderate pain. 04/11/23 04/10/24  Edmisten, Kristie L, PA  VTAMA 1 % CREA Apply 1 Application topically daily as needed (psoriasis.). 02/23/23   [provider]    Allergies  Allergen Reactions   Statins Other (See Comments)    Increased LFTs   Adhesive [Tape] Itching and Rash   Bee Venom Rash    Social History   Socioeconomic History   Marital status: Married    Spouse name: Not on file   Number of children: Not on file   Years of education: Not on file   Highest education level: Not on file  Occupational History   Not on file  Tobacco Use   Smoking status: Never   Smokeless tobacco: Never  Vaping Use   Vaping status: Never Used  Substance and Sexual Activity   Alcohol use: Never    Alcohol/week: 0.0 standard drinks of alcohol   Drug use: Never   Sexual activity: Yes  Other  Topics Concern   Not on file  Social History Narrative   Part owner OF [employer]   Social Determinants of Health   Financial Resource Strain: Not on file  Food Insecurity: Not on file  Transportation Needs: Not on file  Physical Activity: Not on file  Stress: Not on file  Social Connections: Unknown (11/05/2022)   Received from [facility]   Social Network    Social Network: Not on file  Intimate Partner Violence: Unknown (11/05/2022)   Received from [facility], [facility]   HITS    Physically Hurt: Not on file    Insult or Talk Down To: Not on file    Threaten Physical Harm: Not on file    Scream or Curse: Not on file    Tobacco Use: Low Risk  (08/01/2023)   Patient History    Smoking Tobacco Use: Never    Smokeless Tobacco Use: Never    Passive Exposure: Not on file   Social History   Substance and Sexual Activity  Alcohol Use Never   Alcohol/week: 0.0 standard drinks of alcohol    Family History  Problem Relation Age of Onset   Cancer  Mother    Colon polyps Mother    Kidney disease Mother    Heart failure Father    Diabetes Father    Colon cancer Maternal Grandmother     ROS  Objective:  Physical Exam: Well nourished and well developed.  General: Alert and oriented x3, cooperative and pleasant, no acute distress.  Head: normocephalic, atraumatic, neck supple.  Eyes: EOMI. Abdomen: non-tender to palpation and soft, normoactive bowel sounds. Musculoskeletal: - Right knee shows no effusion and a varus deformity. The range is 5 to 125. There is medial tenderness, with no lateral tenderness or instability. Calves soft and nontender. Motor function intact in LE. Strength 5/5 LE bilaterally. Neuro: Distal pulses 2+. Sensation to light touch intact in LE.  Vital signs in last 24 hours:    Imaging Review Plain radiographs demonstrate moderate degenerative joint disease of the right knee. The overall alignment is neutral. The bone quality appears to be adequate for age and reported activity level.  Assessment/Plan:  End stage arthritis, right knee   The patient history, physical examination, clinical judgment of the provider and imaging studies are consistent with end stage degenerative joint disease of the right knee and total knee arthroplasty is deemed medically necessary. The treatment options including medical management, injection therapy arthroscopy and arthroplasty were discussed at length. The risks and benefits of total knee arthroplasty were presented and reviewed. The risks due to aseptic loosening, infection, stiffness, patella tracking problems, thromboembolic complications and other imponderables were discussed. The patient acknowledged the explanation, agreed to proceed with the plan and consent was signed. Patient is being admitted for inpatient treatment for surgery, pain control, PT, OT, prophylactic antibiotics, VTE prophylaxis, progressive ambulation and ADLs and discharge planning. The patient is  planning to be discharged  home .  Patient's anticipated LOS is less than 2 midnights, meeting these requirements: - Younger than 71 - Lives within 1 hour of care - Has a competent adult at home to recover with post-op - NO history of  - Chronic pain requiring opiods  - Diabetes  - Coronary Artery Disease  - Heart failure  - Heart attack  - Stroke  - DVT/VTE  - Cardiac arrhythmia  - Respiratory Failure/COPD  - Renal failure  - Anemia  - Advanced Liver disease  Therapy  Plans: [facility] in [place] Disposition: Home with Wife or Son Planned DVT Prophylaxis: Aspirin 81 mg BID DME Needed: None PCP: Meredith Mody, MD (clearance received) Cardiologist: Olga Millers, MD (clearance received) TXA: IV Allergies: NKDA Anesthesia Concerns: Has woken up during colonoscopy x 2. Metabolizes local anesthetics quickly. BMI: 39.8 Last HgbA1c: 6.2% (03/2023)  Pharmacy: [facility]  Other: -Hold ASA 5-7 days prior to surgery per cardiology  - Patient was instructed on what medications to stop prior to surgery. - Follow-up visit in 2 weeks with Dr. Lequita Halt - Begin physical therapy following surgery - Pre-operative lab work as pre-surgical testing - Prescriptions will be provided in hospital at time of discharge  R. Arcola Jansky, PA-C Orthopedic Surgery [facility] [place]

## 2023-08-08 NOTE — Patient Instructions (Signed)
SURGICAL WAITING ROOM VISITATION Patients having surgery or a procedure may have no more than 2 support people in the waiting area - these visitors may rotate in the visitor waiting room.   Due to an increase in RSV and influenza rates and associated hospitalizations, children ages 51 and under may not visit patients in [facility] hospitals. If the patient needs to stay at the hospital during part of their recovery, the visitor guidelines for inpatient rooms apply.  PRE-OP VISITATION  Pre-op nurse will coordinate an appropriate time for 1 support person to accompany the patient in pre-op.  This support person may not rotate.  This visitor will be contacted when the time is appropriate for the visitor to come back in the pre-op area.  Please refer to the [facility] website for the visitor guidelines for Inpatients (after your surgery is over and you are in a regular room).  You are not required to quarantine at this time prior to your surgery. However, you must do this: Hand Hygiene often Do NOT share personal items Notify your provider if you are in close contact with someone who has COVID or you develop fever 100.4 or greater, new onset of sneezing, cough, sore throat, shortness of breath or body aches.  If you test positive for Covid or have been in contact with anyone that has tested positive in the last 10 days please notify you surgeon.    Your procedure is scheduled on:  Monday   August 15, 2023  Report to [facility] Main Entrance: [place] entrance where the [employer] is available.   Report to admitting at: 06:45    AM  Call this number if you have any questions or problems the morning of surgery [phone]  Do not eat food after Midnight the night prior to your surgery/procedure.  After Midnight you may have the following liquids until  6:15 AM DAY OF SURGERY  Clear Liquid Diet Water Black Coffee (sugar ok, NO MILK/CREAM OR CREAMERS)  Tea (sugar ok, NO  MILK/CREAM OR CREAMERS) regular and decaf                             Plain Jell-O  with no fruit (NO RED)                                           Fruit ices (not with fruit pulp, NO RED)                                     Popsicles (NO RED)                                                                  Juice: NO CITRUS JUICES: only apple, WHITE grape, WHITE cranberry Sports drinks like Gatorade or Powerade (NO RED)                    The day of surgery:  Drink ONE (1) Pre-Surgery G2 at   6:15 AM the morning of  surgery. Drink in one sitting. Do not sip.  This drink was given to you during your hospital pre-op appointment visit. Nothing else to drink after completing the Pre-Surgery G2 : No candy, chewing gum or throat lozenges.    FOLLOW ANY ADDITIONAL PRE OP INSTRUCTIONS YOU RECEIVED FROM YOUR SURGEON'S OFFICE!!!   Oral Hygiene is also important to reduce your risk of infection.        Remember - BRUSH YOUR TEETH THE MORNING OF SURGERY WITH YOUR REGULAR TOOTHPASTE  Do NOT smoke after Midnight the night before surgery.  STOP TAKING all Vitamins, Herbs and supplements 1 week before your surgery.   Stop taking SYNJARDY 72 hours before your surgery.  Last dose will be taken on Thursday  08-11-2023  Stop taking your ASPIRIN one week before your surgery.   Take ONLY these medicines the morning of surgery with A SIP OF WATER: Tylenol if needed for pain.                     You may not have any metal on your body including  jewelry, and body piercing  Do not wear  lotions, powders, cologne, or deodorant  Men may shave face and neck.  Contacts, Hearing Aids, dentures or bridgework may not be worn into surgery. DENTURES WILL BE REMOVED PRIOR TO SURGERY PLEASE DO NOT APPLY "Poly grip" OR ADHESIVES!!!  You may bring a small overnight bag with you on the day of surgery, only pack items that are not valuable. [facility] IS NOT RESPONSIBLE   FOR VALUABLES THAT ARE LOST OR STOLEN.    Do not bring your home medications to the hospital. The Pharmacy will dispense medications listed on your medication list to you during your admission in the Hospital.  Special Instructions: Bring a copy of your healthcare power of attorney and living will documents the day of surgery, if you wish to have them scanned into your [facility] Records- EPIC  Please read over the following fact sheets you were given: IF YOU HAVE QUESTIONS ABOUT YOUR PRE-OP INSTRUCTIONS, PLEASE CALL [phone].     Pre-operative 5 CHG Bath Instructions   You can play a key role in reducing the risk of infection after surgery. Your skin needs to be as free of germs as possible. You can reduce the number of germs on your skin by washing with CHG (chlorhexidine gluconate) soap before surgery. CHG is an antiseptic soap that kills germs and continues to kill germs even after washing.   DO NOT use if you have an allergy to chlorhexidine/CHG or antibacterial soaps. If your skin becomes reddened or irritated, stop using the CHG and notify one of our RNs at [phone]  Please shower with the CHG soap starting 4 days before surgery using the following schedule: START SHOWERS ON  THURSDAY  August 11, 2023  Please keep in mind the following:  DO NOT shave, including legs and underarms, starting the day of your first shower.   You may shave your face at any point before/day of surgery.   Place clean sheets on your bed the day you start using CHG soap. Use a clean washcloth (not used since being washed) for each shower. DO NOT sleep with pets once you start using the CHG.   CHG Shower Instructions:  If you choose to wash your hair and private area, wash first with your normal shampoo/soap.  After you use shampoo/soap, rinse your hair and  body thoroughly to remove shampoo/soap residue.  Turn the water OFF and apply about 3 tablespoons (45 ml) of CHG soap to a CLEAN washcloth.  Apply CHG soap ONLY FROM YOUR NECK DOWN TO YOUR TOES (washing for 3-5 minutes)  DO NOT use CHG soap on face, private areas, open wounds, or sores.  Pay special attention to the area where your surgery is being performed.  If you are having back surgery, having someone wash your back for you may be helpful.  Wait 2 minutes after CHG soap is applied, then you may rinse off the CHG soap.  Pat dry with a clean towel  Put on clean clothes/pajamas   If you choose to wear lotion, please use ONLY the CHG-compatible lotions on the back of this paper.     Additional instructions for the day of surgery: DO NOT APPLY any lotions, deodorants, cologne, or perfumes.   Put on clean/comfortable clothes.  Brush your teeth.  Ask your nurse before applying any prescription medications to the skin.      CHG Compatible Lotions   Aveeno Moisturizing lotion  Cetaphil Moisturizing Cream  Cetaphil Moisturizing Lotion  Clairol Herbal Essence Moisturizing Lotion, Dry Skin  Clairol Herbal Essence Moisturizing Lotion, Extra Dry Skin  Clairol Herbal Essence Moisturizing Lotion, Normal Skin  Curel Age Defying Therapeutic Moisturizing Lotion with Alpha Hydroxy  Curel Extreme Care Body Lotion  Curel Soothing Hands Moisturizing Hand Lotion  Curel Therapeutic Moisturizing Cream, Fragrance-Free  Curel Therapeutic Moisturizing Lotion, Fragrance-Free  Curel Therapeutic Moisturizing Lotion, Original Formula  Eucerin Daily Replenishing Lotion  Eucerin Dry Skin Therapy Plus Alpha Hydroxy Crme  Eucerin Dry Skin Therapy Plus Alpha Hydroxy Lotion  Eucerin Original Crme  Eucerin Original Lotion  Eucerin Plus Crme Eucerin Plus Lotion  Eucerin TriLipid Replenishing Lotion  Keri Anti-Bacterial Hand Lotion  Keri Deep Conditioning Original Lotion Dry Skin Formula Softly Scented   Keri Deep Conditioning Original Lotion, Fragrance Free Sensitive Skin Formula  Keri Lotion Fast Absorbing Fragrance Free Sensitive Skin Formula  Keri Lotion Fast Absorbing Softly Scented Dry Skin Formula  Keri Original Lotion  Keri Skin Renewal Lotion Keri Silky Smooth Lotion  Keri Silky Smooth Sensitive Skin Lotion  Nivea Body Creamy Conditioning Oil  Nivea Body Extra Enriched Lotion  Nivea Body Original Lotion  Nivea Body Sheer Moisturizing Lotion Nivea Crme  Nivea Skin Firming Lotion  NutraDerm 30 Skin Lotion  NutraDerm Skin Lotion  NutraDerm Therapeutic Skin Cream  NutraDerm Therapeutic Skin Lotion  ProShield Protective Hand Cream  Provon moisturizing lotion   FAILURE TO FOLLOW THESE INSTRUCTIONS MAY RESULT IN THE CANCELLATION OF YOUR SURGERY  PATIENT SIGNATURE_________________________________  NURSE SIGNATURE__________________________________  ________________________________________________________________________      Rogelia Mire    An incentive spirometer is a tool that can help keep your lungs clear and active. This tool measures how well you are filling your lungs with each breath. Taking long  deep breaths may help reverse or decrease the chance of developing breathing (pulmonary) problems (especially infection) following: A long period of time when you are unable to move or be active. BEFORE THE PROCEDURE  If the spirometer includes an indicator to show your best effort, your nurse or respiratory therapist will set it to a desired goal. If possible, sit up straight or lean slightly forward. Try not to slouch. Hold the incentive spirometer in an upright position. INSTRUCTIONS FOR USE  Sit on the edge of your bed if possible, or sit up as far as you can in bed or on a chair. Hold the incentive spirometer in an upright position. Breathe out normally. Place the mouthpiece in your mouth and seal your lips tightly around it. Breathe in slowly and as deeply  as possible, raising the piston or the ball toward the top of the column. Hold your breath for 3-5 seconds or for as long as possible. Allow the piston or ball to fall to the bottom of the column. Remove the mouthpiece from your mouth and breathe out normally. Rest for a few seconds and repeat Steps 1 through 7 at least 10 times every 1-2 hours when you are awake. Take your time and take a few normal breaths between deep breaths. The spirometer may include an indicator to show your best effort. Use the indicator as a goal to work toward during each repetition. After each set of 10 deep breaths, practice coughing to be sure your lungs are clear. If you have an incision (the cut made at the time of surgery), support your incision when coughing by placing a pillow or rolled up towels firmly against it. Once you are able to get out of bed, walk around indoors and cough well. You may stop using the incentive spirometer when instructed by your caregiver.  RISKS AND COMPLICATIONS Take your time so you do not get dizzy or light-headed. If you are in pain, you may need to take or ask for pain medication before doing incentive spirometry. It is harder to take a deep breath if you are having pain. AFTER USE Rest and breathe slowly and easily. It can be helpful to keep track of a log of your progress. Your caregiver can provide you with a simple table to help with this. If you are using the spirometer at home, follow these instructions: SEEK MEDICAL CARE IF:  You are having difficultly using the spirometer. You have trouble using the spirometer as often as instructed. Your pain medication is not giving enough relief while using the spirometer. You develop fever of 100.5 F (38.1 C) or higher.                                                                                                    SEEK IMMEDIATE MEDICAL CARE IF:  You cough up bloody sputum that had not been present before. You develop fever of  102 F (38.9 C) or greater. You develop worsening pain at or near the incision site. MAKE SURE YOU:  Understand these instructions. Will watch your condition. Will  get help right away if you are not doing well or get worse. Document Released: 12/20/2006 Document Revised: 11/01/2011 Document Reviewed: 02/20/2007 [facility] Patient Information 2014 [place], [place].

## 2023-08-08 NOTE — Progress Notes (Signed)
COVID Vaccine received:  []  No [x]  Yes Date of any COVID positive Test in last 90 days:  PCP - Meredith Mody, MD   clearance scanned to media 06-30-2023 Cardiologist - Olga Millers, MD  Carlos Levering, NP  cardiac clearance in 08-01-23 Epic note Endocrinology- Talmage Coin, MD at [facility]  Chest x-ray -  EKG - 03-29-2023  Epic  Stress Test -  ECHO -07-26-2023  Epic  Cardiac Cath -  CT calcium score-  49 on 06-29-2021  PCR screen: [x]  Ordered & Completed []   No Order but Needs PROFEND     []   N/A for this surgery  Surgery Plan:  []  Ambulatory   [x]  Outpatient in bed  []  Admit Anesthesia:    []  General  []  Spinal  [x]   Choice []   MAC  Pacemaker / ICD device [x]  No []  Yes   Spinal Cord Stimulator:[x]  No []  Yes       History of Sleep Apnea? []  No [x]  Yes   CPAP used?- [x]  No []  Yes    Does the patient monitor blood sugar?   []  N/A   []  No [x]  Yes  Patient has: []  NO Hx DM   [x]  Pre-DM   []  DM1  []   DM2 Last A1c was: 6.2  on  03-29-2023    Does patient have a [employer] or Dexacom? []  No []  Yes   Fasting Blood Sugar Ranges-  Checks Blood Sugar _____ times a day  SGLT-2 inhibitors / usual dose - SYNJARDY  25-1000mg  daily   SGLT-2 instructions: Hold x 72 hours  Blood Thinner / Instructions:none Aspirin Instructions:  ASA 81 mg,  hold 5-7 days prior  ERAS Protocol Ordered: []  No  [x]  Yes PRE-SURGERY []  ENSURE  [x]  G2   Patient is to be NPO after: 06:15  Dental hx: []  Dentures:  []  N/A      []  Bridge or Partial:                   []  Loose or Damaged teeth:   Comments: Patient was given the 5 CHG shower / bath instructions for TKA surgery along with 2 bottles of the CHG soap. Patient will start this on: 08-11-2023  All questions were asked and answered, Patient voiced understanding of this process.   Activity level: Patient is able / unable to climb a flight of stairs without difficulty; []  No CP  []  No SOB, but would have ___   Patient can / can not perform ADLs without  assistance.   Anesthesia review: PRE-DM, OSA- no CPAP, HTN, gout, CAD, diastolic HF, murmur- moderate AS on ECHO 07-26-23, abnormal LFTs, "wakes up during procedures"   Patient denies shortness of breath, fever, cough and chest pain at PAT appointment.  Patient verbalized understanding and agreement to the Pre-Surgical Instructions that were given to them at this PAT appointment. Patient was also educated of the need to review these PAT instructions again prior to his surgery.I reviewed the appropriate phone numbers to call if they have any and questions or concerns.

## 2023-08-10 ENCOUNTER — Encounter (HOSPITAL_COMMUNITY): Payer: Self-pay

## 2023-08-10 ENCOUNTER — Encounter (HOSPITAL_COMMUNITY)
Admission: RE | Admit: 2023-08-10 | Discharge: 2023-08-10 | Disposition: A | Discharge status: Home / Self Care | Payer: BC Managed Care – PPO | Source: Ambulatory Visit | Attending: Orthopedic Surgery | Admitting: Orthopedic Surgery

## 2023-08-10 DIAGNOSIS — I10 Essential (primary) hypertension: Secondary | ICD-10-CM

## 2023-08-10 DIAGNOSIS — I5032 Chronic diastolic (congestive) heart failure: Secondary | ICD-10-CM

## 2023-08-10 DIAGNOSIS — Z01818 Encounter for other preprocedural examination: Secondary | ICD-10-CM

## 2023-08-10 DIAGNOSIS — R945 Abnormal results of liver function studies: Secondary | ICD-10-CM

## 2023-08-15 ENCOUNTER — Ambulatory Visit (HOSPITAL_COMMUNITY)
Admission: RE | Admit: 2023-08-15 | Payer: BC Managed Care – PPO | Source: Home / Self Care | Admitting: Orthopedic Surgery

## 2023-08-15 ENCOUNTER — Encounter (HOSPITAL_COMMUNITY): Admission: RE | Payer: Self-pay | Source: Home / Self Care

## 2023-08-15 SURGERY — ARTHROPLASTY, KNEE, TOTAL
Anesthesia: Choice | Site: Knee | Laterality: Right

## 2023-10-09 NOTE — Patient Instructions (Signed)
 SURGICAL WAITING ROOM VISITATION Patients having surgery or a procedure may have no more than 2 support people in the waiting area - these visitors may rotate in the visitor waiting room.   Due to an increase in RSV and influenza rates and associated hospitalizations, children ages 80 and under may not visit patients in [facility] hospitals. If the patient needs to stay at the hospital during part of their recovery, the visitor guidelines for inpatient rooms apply.   PRE-OP VISITATION  Pre-op nurse will coordinate an appropriate time for 1 support person to accompany the patient in pre-op.  This support person may not rotate.  This visitor will be contacted when the time is appropriate for the visitor to come back in the pre-op area.   Please refer to the [facility] website for the visitor guidelines for Inpatients (after your surgery is over and you are in a regular room).   You are not required to quarantine at this time prior to your surgery. However, you must do this: Hand Hygiene often Do NOT share personal items Notify your provider if you are in close contact with someone who has COVID or you develop fever 100.4 or greater, new onset of sneezing, cough, sore throat, shortness of breath or body aches.  If you test positive for Covid or have been in contact with anyone that has tested positive in the last 10 days please notify you surgeon.     Your procedure is scheduled on:  Monday   October 24, 2023   Report to [facility] Main Entrance: [address] entrance where the [employer] is available.    Report to admitting at: 05:15    AM   Call this number if you have any questions or problems the morning of surgery [phone]   Do not eat food after Midnight the night prior to your surgery/procedure.   After Midnight you may have the following liquids until  04:15 AM DAY OF SURGERY   Clear Liquid Diet Water Black Coffee (sugar ok, NO MILK/CREAM OR CREAMERS)  Tea (sugar ok,  NO MILK/CREAM OR CREAMERS) regular and decaf                             Plain Jell-O  with no fruit (NO RED)                                           Fruit ices (not with fruit pulp, NO RED)                                     Popsicles (NO RED)                                                                  Juice: NO CITRUS JUICES: only apple, WHITE grape, WHITE cranberry Sports drinks like Gatorade or Powerade (NO RED)                           The  day of surgery:  Drink ONE (1) Pre-Surgery G2 at   4:15 AM the morning of surgery. Drink in one sitting. Do not sip.  This drink was given to you during your hospital pre-op appointment visit. Nothing else to drink after completing the Pre-Surgery G2 : No candy, chewing gum or throat lozenges.     FOLLOW ANY ADDITIONAL PRE OP INSTRUCTIONS YOU RECEIVED FROM YOUR SURGEON'S OFFICE!!!    Oral Hygiene is also important to reduce your risk of infection.        Remember - BRUSH YOUR TEETH THE MORNING OF SURGERY WITH YOUR REGULAR TOOTHPASTE   Do NOT smoke after Midnight the night before surgery.   STOP TAKING all Vitamins, Herbs and supplements 1 week before your surgery.    Stop taking SYNJARDY 72 hours before your surgery.  Last dose will be taken on Thursday  10-20-23   Stop taking your ASPIRIN one week before your surgery.    Take ONLY these medicines the morning of surgery with A SIP OF WATER: Tylenol if needed for pain. You may use your Eye drops.                     You may not have any metal on your body including  jewelry, and body piercing   Do not wear  lotions, powders, cologne, or deodorant   Men may shave face and neck.   Contacts, Hearing Aids, dentures or bridgework may not be worn into surgery. DENTURES WILL BE REMOVED PRIOR TO SURGERY PLEASE DO NOT APPLY "Poly grip" OR ADHESIVES!!!   You may bring a small overnight bag with you on the day of surgery, only pack items that are not valuable. [facility] IS NOT  RESPONSIBLE   FOR VALUABLES THAT ARE LOST OR STOLEN.    Do not bring your home medications to the hospital. The Pharmacy will dispense medications listed on your medication list to you during your admission in the Hospital.   Special Instructions: Bring a copy of your healthcare power of attorney and living will documents the day of surgery, if you wish to have them scanned into your [facility] Records- EPIC   Please read over the following fact sheets you were given: IF YOU HAVE QUESTIONS ABOUT YOUR PRE-OP INSTRUCTIONS, PLEASE CALL [phone].        Pre-operative 5 CHG Bath Instructions    You can play a key role in reducing the risk of infection after surgery. Your skin needs to be as free of germs as possible. You can reduce the number of germs on your skin by washing with CHG (chlorhexidine gluconate) soap before surgery. CHG is an antiseptic soap that kills germs and continues to kill germs even after washing.    DO NOT use if you have an allergy to chlorhexidine/CHG or antibacterial soaps. If your skin becomes reddened or irritated, stop using the CHG and notify one of our RNs at [phone]   Please shower with the CHG soap starting 4 days before surgery using the following schedule: START SHOWERS ON  THURSDAY  October 20, 2023  Please keep in mind the following:  DO NOT shave, including legs and underarms, starting the day of your first shower.   You may shave your face at any point before/day of surgery.    Place clean sheets on your bed the day you start using CHG soap. Use a clean washcloth (not used since being washed) for each shower. DO NOT sleep with pets once you start using the CHG.    CHG Shower Instructions:  If you choose to wash your hair and private area, wash first with your normal  shampoo/soap.  After you use shampoo/soap, rinse your hair and body thoroughly to remove shampoo/soap residue.  Turn the water OFF and apply about 3 tablespoons (45 ml) of CHG soap to a CLEAN washcloth.  Apply CHG soap ONLY FROM YOUR NECK DOWN TO YOUR TOES (washing for 3-5 minutes)  DO NOT use CHG soap on face, private areas, open wounds, or sores.  Pay special attention to the area where your surgery is being performed.  If you are having back surgery, having someone wash your back for you may be helpful.   Wait 2 minutes after CHG soap is applied, then you may rinse off the CHG soap.  Pat dry with a clean towel  Put on clean clothes/pajamas   If you choose to wear lotion, please use ONLY the CHG-compatible lotions on the back of this paper.     Additional instructions for the day of surgery: DO NOT APPLY any lotions, deodorants, cologne, or perfumes.   Put on clean/comfortable clothes.  Brush your teeth.  Ask your nurse before applying any prescription medications to the skin.          CHG Compatible Lotions    Aveeno Moisturizing lotion  Cetaphil Moisturizing Cream  Cetaphil Moisturizing Lotion  Clairol Herbal Essence Moisturizing Lotion, Dry Skin  Clairol Herbal Essence Moisturizing Lotion, Extra Dry Skin  Clairol Herbal Essence Moisturizing Lotion, Normal Skin  Curel Age Defying Therapeutic Moisturizing Lotion with Alpha Hydroxy  Curel Extreme Care Body Lotion  Curel Soothing Hands Moisturizing Hand Lotion  Curel Therapeutic Moisturizing Cream, Fragrance-Free  Curel Therapeutic Moisturizing Lotion, Fragrance-Free  Curel Therapeutic Moisturizing Lotion, Original Formula  Eucerin Daily Replenishing Lotion  Eucerin Dry Skin Therapy Plus Alpha Hydroxy Crme  Eucerin Dry Skin Therapy Plus Alpha Hydroxy Lotion  Eucerin Original Crme  Eucerin Original Lotion  Eucerin Plus Crme Eucerin Plus Lotion  Eucerin TriLipid Replenishing Lotion  Keri Anti-Bacterial Hand Lotion  Keri  Deep Conditioning Original Lotion Dry Skin Formula Softly Scented  Keri Deep Conditioning Original Lotion, Fragrance Free Sensitive Skin Formula  Keri Lotion Fast Absorbing Fragrance Free Sensitive Skin Formula  Keri Lotion Fast Absorbing Softly Scented Dry Skin Formula  Keri Original Lotion  Keri Skin Renewal Lotion Keri Silky Smooth Lotion  Keri Silky Smooth Sensitive Skin Lotion  Nivea Body Creamy Conditioning Oil  Nivea Body Extra Enriched Lotion  Nivea Body Original Lotion  Nivea Body Sheer Moisturizing Lotion Nivea Crme  Nivea Skin Firming Lotion  NutraDerm 30 Skin Lotion  NutraDerm Skin Lotion  NutraDerm Therapeutic Skin Cream  NutraDerm Therapeutic Skin Lotion  ProShield Protective Hand Cream  Provon moisturizing lotion     FAILURE TO FOLLOW THESE INSTRUCTIONS MAY RESULT IN THE CANCELLATION OF YOUR SURGERY   PATIENT SIGNATURE_________________________________   NURSE SIGNATURE__________________________________   ________________________________________________________________________          Rogelia Mire      An incentive spirometer is a tool that can help keep your  lungs clear and active. This tool measures how well you are filling your lungs with each breath. Taking long deep breaths may help reverse or decrease the chance of developing breathing (pulmonary) problems (especially infection) following: A long period of time when you are unable to move or be active. BEFORE THE PROCEDURE  If the spirometer includes an indicator to show your best effort, your nurse or respiratory therapist will set it to a desired goal. If possible, sit up straight or lean slightly forward. Try not to slouch. Hold the incentive spirometer in an upright position. INSTRUCTIONS FOR USE  Sit on the edge of your bed if possible, or sit up as far as you can in bed or on a chair. Hold the incentive spirometer in an upright position. Breathe out normally. Place the mouthpiece in  your mouth and seal your lips tightly around it. Breathe in slowly and as deeply as possible, raising the piston or the ball toward the top of the column. Hold your breath for 3-5 seconds or for as long as possible. Allow the piston or ball to fall to the bottom of the column. Remove the mouthpiece from your mouth and breathe out normally. Rest for a few seconds and repeat Steps 1 through 7 at least 10 times every 1-2 hours when you are awake. Take your time and take a few normal breaths between deep breaths. The spirometer may include an indicator to show your best effort. Use the indicator as a goal to work toward during each repetition. After each set of 10 deep breaths, practice coughing to be sure your lungs are clear. If you have an incision (the cut made at the time of surgery), support your incision when coughing by placing a pillow or rolled up towels firmly against it. Once you are able to get out of bed, walk around indoors and cough well. You may stop using the incentive spirometer when instructed by your caregiver.  RISKS AND COMPLICATIONS Take your time so you do not get dizzy or light-headed. If you are in pain, you may need to take or ask for pain medication before doing incentive spirometry. It is harder to take a deep breath if you are having pain. AFTER USE Rest and breathe slowly and easily. It can be helpful to keep track of a log of your progress. Your caregiver can provide you with a simple table to help with this. If you are using the spirometer at home, follow these instructions: SEEK MEDICAL CARE IF:  You are having difficultly using the spirometer. You have trouble using the spirometer as often as instructed. Your pain medication is not giving enough relief while using the spirometer. You develop fever of 100.5 F (38.1 C) or higher.                                                                                                    SEEK IMMEDIATE MEDICAL CARE IF:  You  cough up bloody sputum that had not been present before. You develop fever of 102 F (38.9 C) or greater. You develop worsening  pain at or near the incision site. MAKE SURE YOU:  Understand these instructions. Will watch your condition. Will get help right away if you are not doing well or get worse. Document Released: 12/20/2006 Document Revised: 11/01/2011 Document Reviewed: 02/20/2007 [facility] Patient Information 2014 [place], [place].    If you would like to see a video about joint replacement:   [email or web address]

## 2023-10-09 NOTE — Progress Notes (Signed)
 COVID Vaccine received:  []  No [x]  Yes Date of any COVID positive Test in last 90 days:   PCP - Meredith Mody, MD   clearance scanned to media 06-30-2023 Cardiologist - Olga Millers, MD  Carlos Levering, NP  cardiac clearance in 08-01-23 Epic note Endocrinology- Talmage Coin, MD at [facility]   Chest x-ray -  EKG - 03-29-2023  Epic  Stress Test -  ECHO -07-26-2023  Epic  Cardiac Cath -  CT calcium score-  49 on 06-29-2021   PCR screen: [x]  Ordered & Completed []   No Order but Needs PROFEND     []   N/A for this surgery   Surgery Plan:  []  Ambulatory   [x]  Outpatient in bed  []  Admit Anesthesia:    []  General  []  Spinal  [x]   Choice []   MAC   Pacemaker / ICD device [x]  No []  Yes   Spinal Cord Stimulator:[x]  No []  Yes       History of Sleep Apnea? []  No [x]  Yes   CPAP used?- [x]  No []  Yes     Does the patient monitor blood sugar?   []  N/A   []  No [x]  Yes  Patient has: []  NO Hx DM   [x]  Pre-DM   []  DM1  []   DM2 Last A1c was: 6.2  on  03-29-2023    Does patient have a [employer] or Dexacom? []  No []  Yes   Fasting Blood Sugar Ranges-  Checks Blood Sugar _____ times a day   SGLT-2 inhibitors / usual dose - SYNJARDY  25-1000mg  daily   SGLT-2 instructions: Hold x 72 hours   Blood Thinner / Instructions:none Aspirin Instructions:  ASA 81 mg,  hold 5-7 days prior   ERAS Protocol Ordered: []  No  [x]  Yes PRE-SURGERY []  ENSURE  [x]  G2   Patient is to be NPO after: 06:15   Dental hx: []  Dentures:  []  N/A      []  Bridge or Partial:                   []  Loose or Damaged teeth:    Comments: Patient was given the 5 CHG shower / bath instructions for TKA surgery along with 2 bottles of the CHG soap. Patient will start this on: 08-11-2023  All questions were asked and answered, Patient voiced understanding of this process.    Activity level: Patient is able / unable to climb a flight of stairs without difficulty; []  No CP  []  No SOB, but would have ___   Patient can / can not perform  ADLs without assistance.    Anesthesia review: PRE-DM, OSA- no CPAP, HTN, gout, CAD, diastolic HF, murmur- moderate AS on ECHO 07-26-23, abnormal LFTs, "wakes up during procedures" Has woken up during colonoscopy x 2. Metabolizes local anesthetics quickly.    Patient denies shortness of breath, fever, cough and chest pain at PAT appointment.   Patient verbalized understanding and agreement to the Pre-Surgical Instructions that were given to them at this PAT appointment. Patient was also educated of the need to review these PAT instructions again prior to his surgery.I reviewed the appropriate phone numbers to call if they have any and questions or concerns.

## 2023-10-11 ENCOUNTER — Encounter (HOSPITAL_COMMUNITY)
Admission: RE | Admit: 2023-10-11 | Discharge: 2023-10-11 | Disposition: A | Discharge status: Home / Self Care | Payer: Medicaid Other | Source: Ambulatory Visit | Attending: Family Medicine | Admitting: Family Medicine

## 2023-10-11 NOTE — H&P (Signed)
 TOTAL KNEE ADMISSION H&P  Patient is being admitted for right total knee arthroplasty.  Subjective:  Chief Complaint: Right knee pain.  HPI: Bryan Austin, 63 y.o. male has a history of pain and functional disability in the right knee due to arthritis and has failed non-surgical conservative treatments for greater than 12 weeks to include NSAID's and/or analgesics, corticosteriod injections, viscosupplementation injections, and activity modification. Onset of symptoms was gradual, starting several years ago with gradually worsening course since that time. The patient noted no past surgery on the right knee.  Patient currently rates pain in the right knee at 8 out of 10 with activity. Patient has night pain, worsening of pain with activity and weight bearing, and pain that interferes with activities of daily living. Patient has evidence of  bone-on-bone medial arthritis with lateral marginal osteophytes and some patellofemoral osteophytes. There is also slight lateral subluxation of the tibia  by imaging studies. There is no active infection.   Patient Active Problem List   Diagnosis Date Noted   Agatston CAC score, <100 01/26/2023   Coronary artery disease involving native coronary artery of native heart without angina pectoris 01/26/2023   Diabetes mellitus without complication (HCC) 01/26/2023   Osteoarthritis of left knee 12/04/2022   Osteoarthritis of right knee 12/04/2022   Hyperlipidemia 03/15/2022   External hemorrhoids 12/09/2020   History of colonic polyps 12/09/2020   Dislocation of proximal interphalangeal joint of left middle finger 06/12/2019   Diastolic CHF, chronic (HCC) 04/10/2018   Chest pain 04/10/2018   Morbid obesity (HCC) 04/10/2018   Essential hypertension 04/05/2018   Aortic valve disorder 04/05/2018   Angina pectoris (HCC) 09/06/2014   Nonspecific abnormal results of liver function study 01/29/2004    Past Medical History:  Diagnosis Date   Arthritis     Neck C3,C4, C5   Complication of anesthesia    wakes up during procedures per pt   Diabetes mellitus without complication (HCC)    GERD without esophagitis 2016   NEG CARDIAC EVALUATION [facility]   Gout    Heart murmur    Hypertension    Sleep apnea    no cpap , no mouth device    Past Surgical History:  Procedure Laterality Date   CHOLECYSTECTOMY  08/23/2004   GB FULL OF "SAND", NEEDED A DRAIN DUE TO INFECTION   COLONOSCOPY  [facility]   COLONOSCOPY N/A 10/14/2014   Procedure: COLONOSCOPY;  Surgeon: West Bali, MD;  [facility];  Service: Endoscopy;  Laterality: N/A;  100pm   COLONOSCOPY WITH PROPOFOL N/A 02/24/2021   Procedure: COLONOSCOPY WITH PROPOFOL;  Surgeon: Dolores Frame, MD;  [facility];  Service: Gastroenterology;  Laterality: N/A;  8:35   ESOPHAGOGASTRODUODENOSCOPY N/A 10/14/2014   Procedure: ESOPHAGOGASTRODUODENOSCOPY (EGD);  Surgeon: West Bali, MD;  [facility];  Service: Endoscopy;  Laterality: N/A;   HEMORRHOID BANDING N/A 10/14/2014   Procedure: HEMORRHOID BANDING;  Surgeon: West Bali, MD;  [facility];  Service: Endoscopy;  Laterality: N/A;   HEMOSTASIS CLIP PLACEMENT  02/24/2021   Procedure: HEMOSTASIS CLIP PLACEMENT;  Surgeon: Dolores Frame, MD;  [facility];  Service: Gastroenterology;;   HERNIA REPAIR  08/23/2004   COMPLICATED BY PROLONGED ILEUS   PARTIAL KNEE ARTHROPLASTY Left 04/11/2023   Procedure: LEFT KNEE MEDIAL UNICOMPARTMENTAL ARTHROPLASTY;  Surgeon: Ollen Gross, MD;  [facility];  Service: Orthopedics;  Laterality: Left;   POLYPECTOMY  02/24/2021   Procedure: POLYPECTOMY;  Surgeon:  Dolores Frame, MD;  [facility];  Service: Gastroenterology;;   urinary tract surgery      age 47    Prior to Admission medications   Medication Sig Start Date End Date Taking? Authorizing Provider  aspirin EC 81 MG tablet Take 81 mg by mouth daily.  Swallow whole.   Yes [provider]  AUVI-Q 0.3 MG/0.3ML SOAJ injection Inject 0.3 mg into the muscle as needed for anaphylaxis.   Yes [provider]  Cholecalciferol (VITAMIN D3) 50 MCG (2000 UT) TABS Take 4,000 Units by mouth in the morning.   Yes [provider]  Coenzyme Q10 (COQ10) 100 MG CAPS Take 200 mg by mouth daily.   Yes [provider]  Colchicine 0.6 MG CAPS Take 0.6-1.2 mg by mouth daily as needed (gout flares).   Yes [provider]  diclofenac (VOLTAREN) 50 MG EC tablet Take 50 mg by mouth daily.   Yes [provider]  furosemide (LASIX) 40 MG tablet Take 40 mg by mouth daily as needed for fluid.   Yes [provider]  irbesartan (AVAPRO) 150 MG tablet Take 1 tablet (150 mg total) by mouth daily. 01/12/22  Yes Lewayne Bunting, MD  ketorolac (ACULAR) 0.5 % ophthalmic solution Place 1 drop into the right eye 4 (four) times daily.   Yes [provider]  Melatonin 10 MG TABS Take 20 mg by mouth at bedtime.   Yes [provider]  NON FORMULARY Inject 2 Syringes as directed once a week. 2 different types of Allergy shots   Yes [provider]  prednisoLONE acetate (PRED FORTE) 1 % ophthalmic suspension 1 drop See admin instructions. Instill 1 drop left eye once daily, instill 1 drop into the right eye 3 times daily   Yes [provider]  senna (SENOKOT) 8.6 MG tablet Take 2 tablets by mouth daily.   Yes [provider]  SYNJARDY XR 25-1000 MG TB24 Take 1 tablet by mouth in the morning. 12/28/22  Yes [provider]    Allergies  Allergen Reactions   Statins Other (See Comments)    Increased LFTs   Adhesive [Tape] Itching and Rash   Bee Venom Rash    Social History   Socioeconomic History   Marital status: Married    Spouse name: Not on file   Number of children: Not on file   Years of education: Not on file   Highest education level: Not on file  Occupational  History   Not on file  Tobacco Use   Smoking status: Never   Smokeless tobacco: Never  Vaping Use   Vaping status: Never Used  Substance and Sexual Activity   Alcohol use: Never    Alcohol/week: 0.0 standard drinks of alcohol   Drug use: Never   Sexual activity: Yes  Other Topics Concern   Not on file  Social History Narrative   Part owner OF [employer]   Social Drivers of Health   Financial Resource Strain: Not on file  Food Insecurity: Not on file  Transportation Needs: Not on file  Physical Activity: Not on file  Stress: Not on file  Social Connections: Unknown (11/05/2022)   Received from [facility], [facility]   Social Network    Social Network: Not on file  Intimate Partner Violence: Unknown (11/05/2022)   Received from [facility], [facility]   HITS    Physically Hurt: Not on file    Insult or Talk  Down To: Not on file    Threaten Physical Harm: Not on file    Scream or Curse: Not on file    Tobacco Use: Low Risk  (08/01/2023)   Patient History    Smoking Tobacco Use: Never    Smokeless Tobacco Use: Never    Passive Exposure: Not on file   Social History   Substance and Sexual Activity  Alcohol Use Never   Alcohol/week: 0.0 standard drinks of alcohol    Family History  Problem Relation Age of Onset   Cancer Mother    Colon polyps Mother    Kidney disease Mother    Heart failure Father    Diabetes Father    Colon cancer Maternal Grandmother     ROS  Objective:  Physical Exam: Well nourished and well developed.  General: Alert and oriented x3, cooperative and pleasant, no acute distress.  Head: normocephalic, atraumatic, neck supple.  Eyes: EOMI. Abdomen: non-tender to palpation and soft, normoactive bowel sounds. Musculoskeletal: - Right knee shows no effusion and a varus deformity. The range is 5 to 125. There is medial tenderness, with no lateral tenderness or instability. Calves soft and nontender. Motor function intact  in LE. Strength 5/5 LE bilaterally. Neuro: Distal pulses 2+. Sensation to light touch intact in LE.   Vital signs in last 24 hours: BP: ()/()  Arterial Line BP: ()/()   Imaging Review Plain radiographs demonstrate moderate degenerative joint disease of the right knee. The overall alignment is neutral. The bone quality appears to be adequate for age and reported activity level.  Assessment/Plan:  End stage arthritis, right knee   The patient history, physical examination, clinical judgment of the provider and imaging studies are consistent with end stage degenerative joint disease of the right knee and total knee arthroplasty is deemed medically necessary. The treatment options including medical management, injection therapy arthroscopy and arthroplasty were discussed at length. The risks and benefits of total knee arthroplasty were presented and reviewed. The risks due to aseptic loosening, infection, stiffness, patella tracking problems, thromboembolic complications and other imponderables were discussed. The patient acknowledged the explanation, agreed to proceed with the plan and consent was signed. Patient is being admitted for inpatient treatment for surgery, pain control, PT, OT, prophylactic antibiotics, VTE prophylaxis, progressive ambulation and ADLs and discharge planning. The patient is planning to be discharged  home .  Patient's anticipated LOS is less than 2 midnights, meeting these requirements: - Younger than 80 - Lives within 1 hour of care - Has a competent adult at home to recover with post-op - NO history of  - Chronic pain requiring opiods  - Diabetes  - Coronary Artery Disease  - Heart failure  - Heart attack  - Stroke  - DVT/VTE  - Cardiac arrhythmia  - Respiratory Failure/COPD  - Renal failure  - Anemia  - Advanced Liver disease  Therapy Plans: [facility] in [place] Disposition: Home with Wife or Son Planned DVT Prophylaxis: Aspirin 81 mg BID DME  Needed: None PCP: Meredith Mody, MD (clearance received) Cardiologist: Olga Millers, MD (clearance received) TXA: IV Allergies: NKDA Anesthesia Concerns: Has woken up during colonoscopy x 2. Metabolizes local anesthetics quickly. BMI: 39.8 Last HgbA1c: 6.2% (03/2023)  Pharmacy: [facility]  Other: -Hold ASA 5-7 days prior to surgery per cardiology  - Patient was instructed on what medications to stop prior to surgery. - Follow-up visit in 2 weeks with Dr. Lequita Halt - Begin physical therapy following surgery - Pre-operative lab work as  pre-surgical testing - Prescriptions will be provided in hospital at time of discharge  R. Arcola Jansky, PA-C Orthopedic Surgery [facility] [place]

## 2023-10-12 ENCOUNTER — Encounter (HOSPITAL_COMMUNITY): Payer: Medicaid Other

## 2023-10-14 ENCOUNTER — Telehealth (HOSPITAL_COMMUNITY): Payer: Self-pay | Admitting: *Deleted

## 2023-10-14 NOTE — Telephone Encounter (Signed)
 Faxed referral received from [facility] practice.  Several attempts have been made to seek clarification for reason for referral as none is noted on the fax.  This referral will be scanned into media and deleted from Onbase.

## 2023-11-09 ENCOUNTER — Other Ambulatory Visit: Payer: Self-pay

## 2023-11-09 DIAGNOSIS — I872 Venous insufficiency (chronic) (peripheral): Secondary | ICD-10-CM

## 2023-11-21 ENCOUNTER — Ambulatory Visit (HOSPITAL_COMMUNITY)
Admission: RE | Admit: 2023-11-21 | Discharge: 2023-11-21 | Disposition: A | Discharge status: Home / Self Care | Source: Ambulatory Visit | Attending: Surgery | Admitting: Surgery

## 2023-11-21 DIAGNOSIS — I872 Venous insufficiency (chronic) (peripheral): Secondary | ICD-10-CM | POA: Insufficient documentation

## 2023-11-28 ENCOUNTER — Ambulatory Visit: Admitting: Physician Assistant

## 2023-11-28 VITALS — BP 159/86 | HR 86 | Temp 98.7°F | Ht 71.0 in | Wt 301.6 lb

## 2023-11-28 DIAGNOSIS — I8393 Asymptomatic varicose veins of bilateral lower extremities: Secondary | ICD-10-CM

## 2023-11-28 DIAGNOSIS — I872 Venous insufficiency (chronic) (peripheral): Secondary | ICD-10-CM

## 2023-11-28 NOTE — Progress Notes (Signed)
 [facility] [place]   Reason for referral: Swollen B LE   History of Present Illness  Bryan Austin is a 63 y.o. male who presents with chief complaint: swollen leg.  Patient notes, onset of swelling years ago, associated with weight gain and lifestyle changes due to right Knee OA and inability to exercise.  He has put on a lot of weight.  The patient has had no history of DVT, No history of varicose vein, positive history of venous stasis ulcers on the right LE, no history of  Lymphedema and minimal posterior calf brawny skin changes.  There is unknown family history of venous disorders.  The patient has used OTC compression stockings in the past.  He states he is unable to walk due to his right knee OA, he had th left partial knee replacement done first.  He has primarily edema at the ankle with sock marks.  He has a wedge pillow for elevation, but does not use it.  He denies rest pain, claudication or non healing foot or toe wounds.   Past medical history includes DM, CHF, Aortic bicuspid valve replacement hyperlipidemia-intolerant to statins.  Will refer to lipid clinic for PCSK9 inhibitor.  He is managed on ASA daily.      Past Medical History:  Diagnosis Date   Arthritis    Neck C3,C4, C5   Complication of anesthesia    wakes up during procedures per pt   Diabetes mellitus without complication (HCC)    GERD without esophagitis 2016   [facility]G CARDIAC EVALUATION [facility]   Gout    Heart murmur    Hypertension    Sleep apnea    no cpap , no mouth device    Past Surgical History:  Procedure Laterality Date   CHOLECYSTECTOMY  08/23/2004   GB FULL OF "SAND", NEEDED A DRAIN DUE TO INFECTION   COLONOSCOPY  [facility]   COLONOSCOPY N/A 10/14/2014   Procedure: COLONOSCOPY;  Surgeon: West Bali, MD;  [facility];  Service: Endoscopy;  Laterality: N/A;  100pm   COLONOSCOPY WITH PROPOFOL N/A 02/24/2021   Procedure: COLONOSCOPY WITH PROPOFOL;  Surgeon: Dolores Frame, MD;  [facility];  Service: Gastroenterology;  Laterality: N/A;  8:35   ESOPHAGOGASTRODUODENOSCOPY N/A 10/14/2014   Procedure: ESOPHAGOGASTRODUODENOSCOPY (EGD);  Surgeon: West Bali, MD;  [facility];  Service: Endoscopy;  Laterality: N/A;   HEMORRHOID BANDING N/A 10/14/2014   Procedure: HEMORRHOID BANDING;  Surgeon: West Bali, MD;  [facility];  Service: Endoscopy;  Laterality: N/A;   HEMOSTASIS CLIP PLACEMENT  02/24/2021   Procedure: HEMOSTASIS CLIP PLACEMENT;  Surgeon: Dolores Frame, MD;  [facility];  Service: Gastroenterology;;   HERNIA REPAIR  08/23/2004   COMPLICATED BY PROLONGED ILEUS   PARTIAL KNEE ARTHROPLASTY Left 04/11/2023   Procedure: LEFT KNEE MEDIAL UNICOMPARTMENTAL ARTHROPLASTY;  Surgeon: Ollen Gross, MD;  [facility];  Service: Orthopedics;  Laterality: Left;   POLYPECTOMY  02/24/2021   Procedure: POLYPECTOMY;  Surgeon: Dolores Frame, MD;  [facility];  Service: Gastroenterology;;   urinary tract surgery      age 27    Social History   Socioeconomic History   Marital status: Married    Spouse name: Not on file   Number of children: Not on file   Years of education: Not on file   Highest education level: Not on file  Occupational History   Not  on file  Tobacco Use   Smoking status: Never   Smokeless tobacco: Never  Vaping Use   Vaping status: Never Used  Substance and Sexual Activity   Alcohol use: Never    Alcohol/week: 0.0 standard drinks of alcohol   Drug use: Never   Sexual activity: Yes  Other Topics Concern   Not on file  Social History Narrative   Part owner OF [employer]   Social Drivers of Health   Financial Resource Strain: Not on file  Food Insecurity: Not on file  Transportation Needs: Not on file  Physical Activity: Not on file  Stress: Not on file  Social Connections: Unknown (11/05/2022)   Received from [facility], [facility]   Social Network    Social Network: Not on file  Intimate Partner Violence: Unknown (11/05/2022)   Received from [facility], [facility]   HITS    Physically Hurt: Not on file    Insult or Talk Down To: Not on file    Threaten Physical Harm: Not on file    Scream or Curse: Not on file    Family History  Problem Relation Age of Onset   Cancer Mother    Colon polyps Mother    Kidney disease Mother    Heart failure Father    Diabetes Father    Colon cancer Maternal Grandmother     Current Outpatient Medications on File Prior to Visit  Medication Sig Dispense Refill   aspirin EC 81 MG tablet Take 81 mg by mouth daily. Swallow whole.     AUVI-Q 0.3 MG/0.3ML SOAJ injection Inject 0.3 mg into the muscle as needed for anaphylaxis.     Cholecalciferol (VITAMIN D3) 50 MCG (2000 UT) TABS Take 4,000 Units by mouth in the morning.     Coenzyme Q10 (COQ10) 100 MG CAPS Take 200 mg by mouth daily.     Colchicine 0.6 MG CAPS Take 0.6-1.2 mg by mouth daily as needed (gout flares).     diclofenac (VOLTAREN) 50 MG EC tablet Take 50 mg by mouth daily.     fluticasone (FLONASE) 50 MCG/ACT nasal spray SMARTSIG:1-2 Spray(s) Both Nares Daily     furosemide (LASIX) 40 MG tablet Take 40 mg by mouth daily as needed for fluid.     irbesartan (AVAPRO) 150 MG tablet Take 1 tablet (150 mg total) by mouth daily. 90 tablet 3   ketorolac (ACULAR) 0.5 % ophthalmic solution Place 1 drop into the right eye 4 (four) times daily.     Melatonin 10 MG TABS Take 20 mg by mouth at bedtime.     NON FORMULARY Inject 2 Syringes as directed once a week. 2 different types of Allergy shots     prednisoLONE acetate (PRED FORTE) 1 % ophthalmic suspension 1 drop See admin instructions. Instill 1 drop left eye once daily, instill 1 drop into the right eye 3 times daily     senna (SENOKOT) 8.6 MG tablet Take 2 tablets by mouth daily.     SYNJARDY XR 25-1000 MG TB24 Take 1 tablet by mouth in the morning.      OZEMPIC, 0.25 OR 0.5 MG/DOSE, 2 MG/3ML SOPN INJECT 0.25 MG UNDER THE SKIN FOR 4 WEEKS, THEN INCREASE TO 0.5 MG ONCE A WEEK (Patient not taking: Reported on 11/28/2023)     No current facility-administered medications on file prior to visit.    Allergies as of 11/28/2023 - Review Complete 11/28/2023  Allergen Reaction Noted   Statins Other (See  Comments) 01/26/2023   Adhesive [tape] Itching and Rash 12/11/2014   Bee venom Rash 04/09/2022     ROS:   General:  No weight loss, Fever, chills  HEENT: No recent headaches, no nasal bleeding, no visual changes, no sore throat  Neurologic: No dizziness, blackouts, seizures. No recent symptoms of stroke or mini- stroke. No recent episodes of slurred speech, or temporary blindness.  Cardiac: No recent episodes of chest pain/pressure, no shortness of breath at rest.  No shortness of breath with exertion.  Denies history of atrial fibrillation or irregular heartbeat  Vascular: No history of rest pain in feet.  No history of claudication.  positive history of non-healing ulcer, No history of DVT   Pulmonary: No home oxygen, no productive cough, no hemoptysis,  No asthma or wheezing  Musculoskeletal:  [ x] Arthritis, [ ]  Low back pain,  [x ] Joint pain  Hematologic:No history of hypercoagulable state.  No history of easy bleeding.  No history of anemia  Gastrointestinal: No hematochezia or melena,  No gastroesophageal reflux, no trouble swallowing  Urinary: [ ]  chronic Kidney disease, [ ]  on HD - [ ]  MWF or [ ]  TTHS, [ ]  Burning with urination, [ ]  Frequent urination, [ ]  Difficulty urinating;   Skin: No rashes  Psychological: No history of anxiety,  No history of depression  Physical Examination  Vitals:   11/28/23 0827  BP: (!) 159/86  Pulse: 86  Temp: 98.7 F (37.1 C)  TempSrc: Temporal  SpO2: 96%  Weight: (!) 301 lb 9.6 oz (136.8 kg)  Height: 5\' 11"  (1.803 m)    Body mass index is 42.06 kg/m.  General:  Alert and  oriented, no acute distress HEENT: Normal Neck: No bruit or JVD Pulmonary: Clear to auscultation bilaterally Cardiac: Regular Rate and Rhythm with murmur Abdomen: Soft, non-tender, non-distended, no mass, no scars Skin: No rash      Evidence of right posterior calf 1-2 cm scar from venous stasis ulcer in the past  Extremity Pulses:   radial,  femoral, dorsalis pedis pulses bilaterally Musculoskeletal: No deformity, sock line edema  Neurologic: Upper and lower extremity motor 5/5 and symmetric  DATA:  Venous Reflux Times  +--------------+---------+------+-----------+------------+--------+  RIGHT        Reflux NoRefluxReflux TimeDiameter cmsComments                          Yes                                   +--------------+---------+------+-----------+------------+--------+  CFV                    yes   >1 second                       +--------------+---------+------+-----------+------------+--------+  FV mid        no                                              +--------------+---------+------+-----------+------------+--------+  Popliteal    no                                              +--------------+---------+------+-----------+------------+--------+  GSV at [facility]              yes    >500 ms      0.93              +--------------+---------+------+-----------+------------+--------+  GSV prox thighno                                              +--------------+---------+------+-----------+------------+--------+  GSV mid thigh no                            0.46              +--------------+---------+------+-----------+------------+--------+  GSV dist thigh          yes    >500 ms      0.41              +--------------+---------+------+-----------+------------+--------+  GSV at knee             yes    >500 ms      0.23              +--------------+---------+------+-----------+------------+--------+  GSV  prox calf           yes    >500 ms      0.23              +--------------+---------+------+-----------+------------+--------+  GSV mid calf            yes    >500 ms      0.30              +--------------+---------+------+-----------+------------+--------+  SSV Pop Fossa           yes    >500 ms      0.22              +--------------+---------+------+-----------+------------+--------+  SSV prox calf no                            0.19              +--------------+---------+------+-----------+------------+--------+    Summary:  Right:  - No evidence of deep vein thrombosis seen in the right lower extremity,  from the common femoral through the popliteal veins.  - Venous reflux is noted in the right common femoral vein.  - Venous reflux is noted in the right sapheno-femoral junction.  - Venous reflux is noted in the right greater saphenous vein in the thigh.  - Venous reflux is noted in the right greater saphenous vein in the calf.  - Venous reflux is noted in the right short saphenous vein.     Assessment/Plan: He has developed venous reflux in the past year.    Venous reflux is noted in the right common femoral vein.  - Venous reflux is noted in the right sapheno-femoral junction.  - Venous reflux is noted in the right greater saphenous vein in the thigh.  - Venous reflux is noted in the right greater saphenous vein in the calf.  - Venous reflux is noted in the right short saphenous vein.  There is no evidence of DVT.  The vein size is less than 0.4 cm except at the [facility] where it is enlarged.  The remainder of the GSV is small with reflux.  He does  not qualify for intervention based on his duplex today.   He has a history of a small venous  ulcer that healed.  He wants to proceed with right TKA so he can exercise.  Carrying extra weight around the abdomin increases venous pressure.   I demonstrated proper elevation to reduce edema on a daily basis along with  thigh high compression daily.  If he develops increased edema, varicose veins or another venous ulcer he will call our office.      Mosetta Pigeon PA-C [facility] [place] Office: [phone]  MD in clinic [place]

## 2023-12-13 NOTE — Progress Notes (Signed)
 HPI: FU bicuspid aortic valve. Coronary CTA November 2022 showed calcium  score 49 which was 54th percentile, mild plaque in the first diagonal and no other obstructive disease noted.  The aortic valve is bicuspid with aortic valve calcium  score of 155; aorta normal size.  Echocardiogram December 2024 showed normal LV function, mild left ventricular hypertrophy, grade 1 diastolic dysfunction, moderate aortic stenosis with mean gradient 19.7 mmHg and aortic valve area 1.41 cm.  Since last seen he has some dyspnea on exertion and lower extremity edema.  He denies chest pain or syncope.  He has gained weight.  Current Outpatient Medications  Medication Sig Dispense Refill   aspirin  EC 81 MG tablet Take 81 mg by mouth daily. Swallow whole.     AUVI-Q 0.3 MG/0.3ML SOAJ injection Inject 0.3 mg into the muscle as needed for anaphylaxis.     cephALEXin (KEFLEX) 500 MG capsule TAKE TWO CAPSULES BY MOUTH TWICE DAILY FOR 10 DAYS     Cholecalciferol (VITAMIN D3) 50 MCG (2000 UT) TABS Take 4,000 Units by mouth in the morning.     Coenzyme Q10 (COQ10) 100 MG CAPS Take 200 mg by mouth daily.     Colchicine 0.6 MG CAPS Take 0.6-1.2 mg by mouth daily as needed (gout flares).     diclofenac (CATAFLAM) 50 MG tablet Take 50 mg by mouth.     diclofenac (VOLTAREN) 50 MG EC tablet Take 50 mg by mouth daily.     fluconazole (DIFLUCAN) 150 MG tablet Take 150 mg by mouth.     fluticasone (FLONASE) 50 MCG/ACT nasal spray SMARTSIG:1-2 Spray(s) Both Nares Daily     furosemide  (LASIX ) 40 MG tablet Take 40 mg by mouth daily as needed for fluid.     irbesartan  (AVAPRO ) 150 MG tablet Take 1 tablet (150 mg total) by mouth daily. 90 tablet 3   ketorolac (ACULAR) 0.5 % ophthalmic solution Place 1 drop into the right eye 4 (four) times daily.     Melatonin 10 MG TABS Take 20 mg by mouth at bedtime.     NON FORMULARY Inject 2 Syringes as directed once a week. 2 different types of Allergy shots     OZEMPIC, 0.25 OR 0.5 MG/DOSE,  2 MG/3ML SOPN      prednisoLONE acetate (PRED FORTE) 1 % ophthalmic suspension 1 drop See admin instructions. Instill 1 drop left eye once daily, instill 1 drop into the right eye 3 times daily     senna (SENOKOT) 8.6 MG tablet Take 2 tablets by mouth daily.     SYNJARDY XR 25-1000 MG TB24 Take 1 tablet by mouth in the morning.     No current facility-administered medications for this visit.     Past Medical History:  Diagnosis Date   Arthritis    Neck C3,C4, C5   Complication of anesthesia    wakes up during procedures per pt   Diabetes mellitus without complication (HCC)    GERD without esophagitis 2016   NEG CARDIAC EVALUATION [facility]   Gout    Heart murmur    Hypertension    Sleep apnea    no cpap , no mouth device    Past Surgical History:  Procedure Laterality Date   CHOLECYSTECTOMY  08/23/2004   GB FULL OF "SAND", NEEDED A DRAIN DUE TO INFECTION   COLONOSCOPY  [facility]   COLONOSCOPY N/A 10/14/2014   Procedure: COLONOSCOPY;  Surgeon: Alyce Jubilee, MD;  [facility];  Service: Endoscopy;  Laterality:  N/A;  100pm   COLONOSCOPY WITH PROPOFOL  N/A 02/24/2021   Procedure: COLONOSCOPY WITH PROPOFOL ;  Surgeon: Urban Garden, MD;  [facility];  Service: Gastroenterology;  Laterality: N/A;  8:35   ESOPHAGOGASTRODUODENOSCOPY N/A 10/14/2014   Procedure: ESOPHAGOGASTRODUODENOSCOPY (EGD);  Surgeon: Alyce Jubilee, MD;  [facility];  Service: Endoscopy;  Laterality: N/A;   HEMORRHOID BANDING N/A 10/14/2014   Procedure: HEMORRHOID BANDING;  Surgeon: Alyce Jubilee, MD;  [facility];  Service: Endoscopy;  Laterality: N/A;   HEMOSTASIS CLIP PLACEMENT  02/24/2021   Procedure: HEMOSTASIS CLIP PLACEMENT;  Surgeon: Urban Garden, MD;  [facility];  Service: Gastroenterology;;   HERNIA REPAIR  08/23/2004   COMPLICATED BY PROLONGED ILEUS   PARTIAL KNEE ARTHROPLASTY Left 04/11/2023   Procedure: LEFT KNEE MEDIAL  UNICOMPARTMENTAL ARTHROPLASTY;  Surgeon: Liliane Rei, MD;  [facility];  Service: Orthopedics;  Laterality: Left;   POLYPECTOMY  02/24/2021   Procedure: POLYPECTOMY;  Surgeon: Umberto Ganong, Bearl Limes, MD;  [facility];  Service: Gastroenterology;;   urinary tract surgery      age 63    Social History   Socioeconomic History   Marital status: Married    Spouse name: Not on file   Number of children: Not on file   Years of education: Not on file   Highest education level: Not on file  Occupational History   Not on file  Tobacco Use   Smoking status: Never   Smokeless tobacco: Never  Vaping Use   Vaping status: Never Used  Substance and Sexual Activity   Alcohol use: Never    Alcohol/week: 0.0 standard drinks of alcohol   Drug use: Never   Sexual activity: Yes  Other Topics Concern   Not on file  Social History Narrative   Part owner OF [employer]   Social Drivers of Health   Financial Resource Strain: Not on file  Food Insecurity: Not on file  Transportation Needs: Not on file  Physical Activity: Not on file  Stress: Not on file  Social Connections: Unknown (11/05/2022)   Received from [facility]   Social Network    Social Network: Not on file  Intimate Partner Violence: Unknown (11/05/2022)   Received from [facility], [facility]   HITS    Physically Hurt: Not on file    Insult or Talk Down To: Not on file    Threaten Physical Harm: Not on file    Scream or Curse: Not on file    Family History  Problem Relation Age of Onset   Cancer Mother    Colon polyps Mother    Kidney disease Mother    Heart failure Father    Diabetes Father    Colon cancer Maternal Grandmother     ROS: no fevers or chills, productive cough, hemoptysis, dysphasia, odynophagia, melena, hematochezia, dysuria, hematuria, rash, seizure activity, orthopnea, PND, claudication. Remaining systems are negative.  Physical Exam: Well-developed  obese in no acute distress.  Skin is warm and dry.  HEENT is normal.  Neck is supple.  Chest is clear to auscultation with normal expansion.  Cardiovascular exam is regular rate and rhythm.  2/6 systolic murmur left sternal border.  S2 is not diminished. Abdominal exam nontender or distended. No masses palpated. Extremities show trace edema. neuro grossly intact  EKG Interpretation Date/Time:  Tuesday Dec 27 2023 09:09:07 EDT Ventricular Rate:  73 PR Interval:  156 QRS Duration:  106  QT Interval:  426 QTC Calculation: 469 R Axis:   121  Text Interpretation: Normal sinus rhythm Right axis deviation Confirmed by Alexandria Angel ([id number]) on 12/27/2023 9:12:12 AM    A/P  1 bicuspid aortic valve/aortic stenosis-patient has moderate aortic stenosis on most recent echocardiogram.  Plan follow-up echocardiogram December 2025.  He understands the symptoms to be aware of including worsening dyspnea on exertion, chest pain and syncope.  2 coronary artery disease-mild on previous CTA.  Continue aspirin .  Intolerant to statins.  3 hyperlipidemia-patient is intolerant to statins.  He has not been taking his Repatha  but is now agreeable.  Will resume.  Check lipids and liver in 8 weeks.  4 hypertension-blood pressure is controlled.  Continue present medical regimen.  5 obstructive sleep apnea-managed by pulmonary.  6 obesity-he is scheduled to see a dietitian and also plans to begin Ozempic.  7 chronic diastolic congestive heart failure-he states his lower extremity edema is worse.  Also some dyspnea on exertion.  I will ask him to take his Lasix  daily.  Check potassium and renal function as well as BNP in 1 week.  Alexandria Angel, MD

## 2023-12-15 ENCOUNTER — Telehealth: Payer: Self-pay | Admitting: Orthopedic Surgery

## 2023-12-15 ENCOUNTER — Encounter: Payer: Self-pay | Admitting: Radiology

## 2023-12-15 NOTE — Telephone Encounter (Signed)
 Patient called. Would like info about the study Dr. Julio Ohm is doing.

## 2023-12-16 NOTE — Telephone Encounter (Signed)
 SW pt this morning, he stated he does not have an open wound just venous insufficiency. He would not be qualified for the clinical study.

## 2023-12-19 ENCOUNTER — Ambulatory Visit (HOSPITAL_COMMUNITY): Admission: RE | Admit: 2023-12-19 | Payer: 59 | Source: Home / Self Care | Admitting: Orthopedic Surgery

## 2023-12-19 ENCOUNTER — Encounter (HOSPITAL_COMMUNITY): Admission: RE | Payer: Self-pay | Source: Home / Self Care

## 2023-12-19 SURGERY — ARTHROPLASTY, KNEE, TOTAL
Anesthesia: Choice | Site: Knee | Laterality: Right

## 2023-12-27 ENCOUNTER — Encounter: Payer: Self-pay | Admitting: Cardiology

## 2023-12-27 ENCOUNTER — Ambulatory Visit: Payer: Medicaid Other | Attending: Cardiology | Admitting: Cardiology

## 2023-12-27 VITALS — BP 132/74 | HR 83 | Ht 71.0 in | Wt 301.8 lb

## 2023-12-27 DIAGNOSIS — E7800 Pure hypercholesterolemia, unspecified: Secondary | ICD-10-CM

## 2023-12-27 DIAGNOSIS — E7849 Other hyperlipidemia: Secondary | ICD-10-CM | POA: Diagnosis not present

## 2023-12-27 DIAGNOSIS — Q2381 Bicuspid aortic valve: Secondary | ICD-10-CM

## 2023-12-27 DIAGNOSIS — R0602 Shortness of breath: Secondary | ICD-10-CM

## 2023-12-27 DIAGNOSIS — R7989 Other specified abnormal findings of blood chemistry: Secondary | ICD-10-CM

## 2023-12-27 MED ORDER — FUROSEMIDE 40 MG PO TABS: 40.0000 mg | ORAL_TABLET | Freq: Every day | ORAL | 3 refills | Status: AC

## 2023-12-27 MED ORDER — REPATHA SURECLICK 140 MG/ML ~~LOC~~ SOAJ: 140.0000 mg | SUBCUTANEOUS | 6 refills | Status: AC

## 2023-12-27 NOTE — Patient Instructions (Signed)
 Medication Instructions:   TAKE FUROSEMIDE DAILY  START REPATHA - INJECTION EVERY 14 DAYS  *If you need a refill on your cardiac medications before your next appointment, please call your pharmacy*  Lab Work:  Your physician recommends that you return for lab work in: ONE WEEK-DO NOT NEED TO FAST  Your physician recommends that you return for lab work in: 8 [facility]  If you have labs (blood work) drawn today and your tests are completely normal, you will receive your results only by: MyChart Message (if you have MyChart) OR A paper copy in the mail If you have any lab test that is abnormal or we need to change your treatment, we will call you to review the results.  Follow-Up: At [facility], you and your health needs are our priority.  As part of our continuing mission to provide you with exceptional heart care, our providers are all part of one team.  This team includes your primary Cardiologist (physician) and Advanced Practice Providers or APPs (Physician Assistants and Nurse Practitioners) who all work together to provide you with the care you need, when you need it.  Your next appointment:   6 month(s)  Provider:   Alexandria Angel, MD

## 2024-01-10 ENCOUNTER — Ambulatory Visit: Payer: Self-pay | Admitting: Cardiology

## 2024-01-10 LAB — BASIC METABOLIC PANEL WITH GFR
BUN/Creatinine Ratio: 11 (ref 10–24)
BUN: 13 mg/dL (ref 8–27)
CO2: 21 mmol/L (ref 20–29)
Calcium: 9.4 mg/dL (ref 8.6–10.2)
Chloride: 104 mmol/L (ref 96–106)
Creatinine, Ser: 1.19 mg/dL (ref 0.76–1.27)
Glucose: 125 mg/dL — ABNORMAL HIGH (ref 70–99)
Potassium: 4.7 mmol/L (ref 3.5–5.2)
Sodium: 141 mmol/L (ref 134–144)
eGFR: 68 mL/min/{1.73_m2} (ref >59)

## 2024-01-10 LAB — PRO B NATRIURETIC PEPTIDE: NT-Pro BNP: 46 pg/mL (ref 0–210)

## 2024-03-26 ENCOUNTER — Other Ambulatory Visit (HOSPITAL_COMMUNITY): Payer: Self-pay

## 2024-03-26 ENCOUNTER — Telehealth: Payer: Self-pay | Admitting: Pharmacy Technician

## 2024-03-26 NOTE — Telephone Encounter (Signed)
 Pharmacy Patient Advocate Encounter   Received notification from CoverMyMeds that prior authorization for Repatha  is required/requested.   Insurance verification completed.   The patient is insured through rx advance prescript .   Per test claim: PA required; PA submitted to above mentioned insurance via CoverMyMeds Key/confirmation #/EOC [id number] Status is pending

## 2024-03-26 NOTE — Telephone Encounter (Signed)
 Pharmacy Patient Advocate Encounter  Received notification from rx advance prescript that Prior Authorization for Repatha  has been APPROVED from 03/26/24 to 03/26/25. Ran test claim, Copay is $90.00- 3 months. This test claim was processed through [facility]- copay amounts may vary at other pharmacies due to pharmacy/plan contracts, or as the patient moves through the different stages of their insurance plan.   PA #/Case ID/Reference #: [id number]

## 2024-06-25 ENCOUNTER — Encounter: Payer: Self-pay | Admitting: Radiology

## 2024-07-16 ENCOUNTER — Ambulatory Visit: Payer: Self-pay | Admitting: Cardiology

## 2024-07-16 ENCOUNTER — Ambulatory Visit (HOSPITAL_COMMUNITY)
Admission: RE | Admit: 2024-07-16 | Discharge: 2024-07-16 | Disposition: A | Discharge status: Home / Self Care | Source: Ambulatory Visit | Attending: Cardiovascular Disease | Admitting: Cardiovascular Disease

## 2024-07-16 DIAGNOSIS — Q2381 Bicuspid aortic valve: Secondary | ICD-10-CM | POA: Diagnosis present

## 2024-07-16 DIAGNOSIS — I350 Nonrheumatic aortic (valve) stenosis: Secondary | ICD-10-CM

## 2024-07-16 LAB — ECHOCARDIOGRAM COMPLETE
AR max vel: 1.18 cm2
AV Area VTI: 1.25 cm2
AV Area mean vel: 1.21 cm2
AV Mean grad: 15 mmHg
AV Peak grad: 30.5 mmHg
Ao pk vel: 2.76 m/s
Area-P 1/2: 3.15 cm2
S' Lateral: 3.5 cm

## 2024-07-17 ENCOUNTER — Other Ambulatory Visit: Payer: Self-pay | Admitting: *Deleted

## 2024-07-17 DIAGNOSIS — Q2381 Bicuspid aortic valve: Secondary | ICD-10-CM

## 2024-07-26 ENCOUNTER — Telehealth: Payer: Self-pay | Admitting: Cardiology

## 2024-07-26 NOTE — Telephone Encounter (Signed)
 Attempted to call patient. Left detailed message on voicemail (pt requested) in regards to ECHO results on MyChart and letter being sent out to him.

## 2024-07-26 NOTE — Telephone Encounter (Signed)
Patient called to follow-up on his echocardiogram test results.

## 2024-07-27 NOTE — Telephone Encounter (Signed)
 Pt returned call to f/u please advise

## 2024-07-27 NOTE — Telephone Encounter (Signed)
Spoke with pt, questions regarding echo answered. 

## 2024-08-27 ENCOUNTER — Ambulatory Visit: Admitting: Urology

## 2024-08-27 ENCOUNTER — Encounter: Payer: Self-pay | Admitting: Urology

## 2024-08-27 VITALS — BP 136/71 | HR 88

## 2024-08-27 DIAGNOSIS — R972 Elevated prostate specific antigen [PSA]: Secondary | ICD-10-CM | POA: Diagnosis not present

## 2024-08-27 DIAGNOSIS — R3913 Splitting of urinary stream: Secondary | ICD-10-CM | POA: Diagnosis not present

## 2024-08-27 DIAGNOSIS — N401 Enlarged prostate with lower urinary tract symptoms: Secondary | ICD-10-CM

## 2024-08-27 DIAGNOSIS — R3912 Poor urinary stream: Secondary | ICD-10-CM

## 2024-08-27 DIAGNOSIS — E291 Testicular hypofunction: Secondary | ICD-10-CM | POA: Diagnosis not present

## 2024-08-27 LAB — MICROSCOPIC EXAMINATION
Bacteria, UA: NONE SEEN
WBC, UA: NONE SEEN /HPF (ref 0–5)

## 2024-08-27 LAB — URINALYSIS, ROUTINE W REFLEX MICROSCOPIC
Bilirubin, UA: NEGATIVE
Glucose, UA: NEGATIVE
Ketones, UA: NEGATIVE
Leukocytes,UA: NEGATIVE
Nitrite, UA: NEGATIVE
Protein,UA: NEGATIVE
Specific Gravity, UA: 1.015 (ref 1.005–1.030)
Urobilinogen, Ur: 0.2 mg/dL (ref 0.2–1.0)
pH, UA: 6 (ref 5.0–7.5)

## 2024-08-27 NOTE — Progress Notes (Signed)
 "  08/27/2024 9:33 AM   Bryan Austin 12-05-1960 [id number]  Referring provider: Teresa Jenkins Jansky, FNP [address] [place],  [place] [address]  No chief complaint on file.   HPI:  New pt -    1) elevated PSA - Dec 2025 PSA 4.5, 11 % free. , cr 1.5., gfr 52. No OAC. No FH PCa. He recalls a prior elevation of PSA but returned to nl. No bx.   2) BPH - rx for tamsulosin in past but not taking. He recalls meatal stenosis and repair as a child. 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   3) low  T - on T replacement. Fatigue, weight gain. He recalls T 150. 2018 MRI - nl brain and sella. He sees Dr. Faythe. Going to see hematology.   08/27/2024: Today, seen for the above. IPSS 2. No dysuria or gross hematuria.   UA clear.   He owns [employer].   PMH: Past Medical History:  Diagnosis Date   Arthritis    Neck C3,C4, C5   Complication of anesthesia    wakes up during procedures per pt   Diabetes mellitus without complication (HCC)    GERD without esophagitis 2016   NEG CARDIAC EVALUATION [facility]   Gout    Heart murmur    Hypertension    Sleep apnea    no cpap , no mouth device    Surgical History: Past Surgical History:  Procedure Laterality Date   CHOLECYSTECTOMY  08/23/2004   GB FULL OF SAND, NEEDED A DRAIN DUE TO INFECTION   COLONOSCOPY  [facility]   COLONOSCOPY N/A 10/14/2014   Procedure: COLONOSCOPY;  Surgeon: Margo LITTIE Haddock, MD;  [facility];  Service: Endoscopy;  Laterality: N/A;  100pm   COLONOSCOPY WITH PROPOFOL  N/A 02/24/2021   Procedure: COLONOSCOPY WITH PROPOFOL ;  Surgeon: Eartha Angelia Sieving, MD;  [facility];  Service: Gastroenterology;  Laterality: N/A;  8:35   ESOPHAGOGASTRODUODENOSCOPY N/A 10/14/2014   Procedure: ESOPHAGOGASTRODUODENOSCOPY (EGD);  Surgeon: Margo LITTIE Haddock, MD;  [facility];  Service: Endoscopy;  Laterality: N/A;   HEMORRHOID BANDING N/A 10/14/2014   Procedure: HEMORRHOID BANDING;  Surgeon: Margo LITTIE Haddock, MD;   [facility];  Service: Endoscopy;  Laterality: N/A;   HEMOSTASIS CLIP PLACEMENT  02/24/2021   Procedure: HEMOSTASIS CLIP PLACEMENT;  Surgeon: Eartha Angelia Sieving, MD;  [facility];  Service: Gastroenterology;;   HERNIA REPAIR  08/23/2004   COMPLICATED BY PROLONGED ILEUS   PARTIAL KNEE ARTHROPLASTY Left 04/11/2023   Procedure: LEFT KNEE MEDIAL UNICOMPARTMENTAL ARTHROPLASTY;  Surgeon: Melodi Lerner, MD;  [facility];  Service: Orthopedics;  Laterality: Left;   POLYPECTOMY  02/24/2021   Procedure: POLYPECTOMY;  Surgeon: Eartha Angelia Sieving, MD;  [facility];  Service: Gastroenterology;;   urinary tract surgery      age 64    Home Medications:  Allergies as of 08/27/2024       Reactions   Statins Other (See Comments)   Increased LFTs   Adhesive [tape] Itching, Rash   Bee Venom Rash        Medication List        Accurate as of August 27, 2024  9:33 AM. If you have any questions, ask your nurse or doctor.          aspirin  EC 81 MG tablet Take 81 mg by mouth daily. Swallow whole.   Auvi-Q 0.3 MG/0.3ML Soaj injection Generic drug: EPINEPHrine Inject  0.3 mg into the muscle as needed for anaphylaxis.   cephALEXin 500 MG capsule Commonly known as: KEFLEX TAKE TWO CAPSULES BY MOUTH TWICE DAILY FOR 10 DAYS   Colchicine 0.6 MG Caps Take 0.6-1.2 mg by mouth daily as needed (gout flares).   CoQ10 100 MG Caps Take 200 mg by mouth daily.   diclofenac 50 MG EC tablet Commonly known as: VOLTAREN Take 50 mg by mouth daily.   diclofenac 50 MG tablet Commonly known as: CATAFLAM Take 50 mg by mouth.   fluconazole 150 MG tablet Commonly known as: DIFLUCAN Take 150 mg by mouth.   fluticasone 50 MCG/ACT nasal spray Commonly known as: FLONASE SMARTSIG:1-2 Spray(s) Both Nares Daily   furosemide  40 MG tablet Commonly known as: LASIX  Take 1 tablet (40 mg total) by mouth daily.   irbesartan  150 MG tablet Commonly known as:  Avapro  Take 1 tablet (150 mg total) by mouth daily.   ketorolac 0.5 % ophthalmic solution Commonly known as: ACULAR Place 1 drop into the right eye 4 (four) times daily.   Melatonin 10 MG Tabs Take 20 mg by mouth at bedtime.   NON FORMULARY Inject 2 Syringes as directed once a week. 2 different types of Allergy shots   Ozempic (0.25 or 0.5 MG/DOSE) 2 MG/3ML Sopn Generic drug: Semaglutide(0.25 or 0.5MG /DOS)   prednisoLONE acetate 1 % ophthalmic suspension Commonly known as: PRED FORTE 1 drop See admin instructions. Instill 1 drop left eye once daily, instill 1 drop into the right eye 3 times daily   Repatha  SureClick 140 MG/ML Soaj Generic drug: Evolocumab  Inject 140 mg into the skin every 14 (fourteen) days.   senna 8.6 MG tablet Commonly known as: SENOKOT Take 2 tablets by mouth daily.   Synjardy XR 25-1000 MG Tb24 Generic drug: Empagliflozin-metFORMIN HCl ER Take 1 tablet by mouth in the morning.   Vitamin D3 50 MCG (2000 UT) Tabs Take 4,000 Units by mouth in the morning.        Allergies: Allergies[1]  Family History: Family History  Problem Relation Age of Onset   Cancer Mother    Colon polyps Mother    Kidney disease Mother    Heart failure Father    Diabetes Father    Colon cancer Maternal Grandmother     Social History:  reports that he has never smoked. He has never used smokeless tobacco. He reports that he does not drink alcohol and does not use drugs.   Physical Exam: There were no vitals taken for this visit.  Constitutional:  Alert and oriented, No acute distress. HEENT: [place] AT, moist mucus membranes.  Trachea midline, no masses. Cardiovascular: No clubbing, cyanosis, or edema. Respiratory: Normal respiratory effort, no increased work of breathing. GI: Abdomen is soft, nontender, nondistended, no abdominal masses GU: No CVA tenderness Skin: No rashes, bruises or suspicious lesions. Neurologic: Grossly intact, no focal deficits, moving all 4  extremities. Psychiatric: Normal mood and affect. GU: Penis circumcised, normal foreskin, no meatal stenosis (slight coronal hypospadias), testicles descended bilaterally and palpably normal, bilateral epididymis palpably normal, scrotum normal DRE: Prostate  25 g, smooth without hard area or nodule   Laboratory Data: Lab Results  Component Value Date   WBC 8.5 03/29/2023   HGB 16.7 03/29/2023   HCT 52.0 03/29/2023   MCV 90.3 03/29/2023   PLT 217 03/29/2023    Lab Results  Component Value Date   CREATININE 1.19 01/09/2024    No results found for: PSA  No results found for:  TESTOSTERONE  Lab Results  Component Value Date   HGBA1C 6.2 (H) 03/29/2023    Urinalysis No results found for: COLORURINE, APPEARANCEUR, LABSPEC, PHURINE, GLUCOSEU, HGBUR, BILIRUBINUR, KETONESUR, PROTEINUR, UROBILINOGEN, NITRITE, LEUKOCYTESUR  No results found for: LABMICR, WBCUA, RBCUA, LABEPIT, MUCUS, BACTERIA  Pertinent Imaging:   Assessment & Plan:    1. Elevated PSA (Primary) I had a long discussion with the patient on the nature of elevated PSA - benign vs malignant causes. We discussed age specific levels and PCa risk stratification.  We discussed the nature risks and benefits of continued surveillance, other lab tests, imaging as well as prostate biopsy. We discussed the management of prostate cancer might include active surveillance or treatment depending on biopsy findings. All questions answered. Check PSA, consider MRI and biopsy.   Exam benign.   2. BPH - discussed tamsulosin. Continue surveillance.   3.  Low T-we discussed the relationship between testosterone and prostate cancer.  Sometimes we do have to discontinue and actually lower testosterone for cancer treatment.  For now reasonable to remain eugonadal from a prostate cancer screening point of view.   No follow-ups on file.  Donnice Brooks, MD  [facility]  [address] [place], [place] [address] [phone]      [1]  Allergies Allergen Reactions   Statins Other (See Comments)    Increased LFTs   Adhesive [Tape] Itching and Rash   Bee Venom Rash   "

## 2024-08-28 ENCOUNTER — Ambulatory Visit: Payer: Self-pay

## 2024-08-28 LAB — PSA: Prostate Specific Ag, Serum: 2.5 ng/mL (ref 0.0–4.0)

## 2024-09-03 ENCOUNTER — Other Ambulatory Visit: Payer: Self-pay

## 2024-09-03 DIAGNOSIS — R972 Elevated prostate specific antigen [PSA]: Secondary | ICD-10-CM

## 2024-09-03 NOTE — Telephone Encounter (Signed)
 Called patient to give results per MD Nieves, patient voiced his understanding an appointment was rescheduled for appropriate timeline per MD Nieves

## 2024-09-03 NOTE — Telephone Encounter (Signed)
-----   Message from Donnice Brooks, MD sent at 09/01/2024  2:16 PM EST ----- Let Bryan Austin know his PSA looks good - returned to normal. He can see me back in 6 months - JULY 2026 - with a PSA prior - thank you.  ----- Message ----- From: Sammie Exie HERO, CMA Sent: 08/28/2024   7:35 AM EST To: Donnice Brooks, MD  Please review.

## 2024-10-17 ENCOUNTER — Inpatient Hospital Stay

## 2025-01-29 ENCOUNTER — Ambulatory Visit: Admitting: Physician Assistant

## 2025-02-04 ENCOUNTER — Ambulatory Visit: Admitting: Urology

## 2025-03-04 ENCOUNTER — Other Ambulatory Visit

## 2025-03-11 ENCOUNTER — Ambulatory Visit: Admitting: Urology
